# Patient Record
Sex: Female | Born: 1950
Health system: Southern US, Community
[De-identification: ages and names within clinical notes are randomized; demographics above are authoritative.]

## PROBLEM LIST (undated history)

## (undated) DIAGNOSIS — K219 Gastro-esophageal reflux disease without esophagitis: Secondary | ICD-10-CM

## (undated) DIAGNOSIS — I700 Atherosclerosis of aorta: Secondary | ICD-10-CM

## (undated) HISTORY — DX: Gastro-esophageal reflux disease without esophagitis: K21.9

## (undated) HISTORY — DX: Atherosclerosis of aorta: I70.0

---

## 2014-04-10 ENCOUNTER — Encounter: Payer: Self-pay | Admitting: Podiatry

## 2014-04-10 ENCOUNTER — Ambulatory Visit (INDEPENDENT_AMBULATORY_CARE_PROVIDER_SITE_OTHER): Payer: BC Managed Care – PPO | Admitting: Podiatry

## 2014-04-10 VITALS — BP 121/73 | HR 57 | Ht <= 58 in | Wt 125.0 lb

## 2014-04-10 DIAGNOSIS — M79609 Pain in unspecified limb: Secondary | ICD-10-CM

## 2014-04-10 DIAGNOSIS — M722 Plantar fascial fibromatosis: Secondary | ICD-10-CM | POA: Insufficient documentation

## 2014-04-10 DIAGNOSIS — M79606 Pain in leg, unspecified: Secondary | ICD-10-CM | POA: Insufficient documentation

## 2014-04-10 NOTE — Progress Notes (Signed)
Subjective: 63 year old female presents complaining of right heel pain that starts from bottom and radiate to side of leg for duration of 3-4 months. It is getting worse with more severe pain.  She stands all day at work in [employer].  Review of Systems - General ROS: negative for - chills, fatigue, fever, hot flashes, night sweats, sleep disturbance, weight gain or weight loss Ophthalmic ROS: negative ENT ROS: negative Allergy and Immunology ROS: negative Breast ROS: negative for breast lumps Respiratory ROS: no cough, shortness of breath, or wheezing Cardiovascular ROS: no chest pain or dyspnea on exertion Gastrointestinal ROS: no abdominal pain, change in bowel habits, or black or bloody stools Genito-Urinary ROS: no dysuria, trouble voiding, or hematuria Musculoskeletal ROS: negative Neurological ROS: no TIA or stroke symptoms Dermatological ROS: negative.  Objective: Dermatologic: Normotrophic skin without any lesions. Vascular: All pedal pulses are palpable. Orthopedic: Forefoot varus bilateral. Mild bunion bilateral. Neurologic: All epicritic and tactile sensations grossly intact.  Assessment: Plantar fasciitis right.  Plan: Reviewed clinical findings and available treatment options. Right heel injected with 5mg  Triamcinolone and 4mg  Dexamethasone mixed with 1ml of 0.5% Marcaine plain. Patient tolerated well. Return in 2 weeks for follow up.

## 2014-04-10 NOTE — Patient Instructions (Signed)
Seen for pain in right heel. Injection given. Return in 2 weeks.

## 2014-04-24 ENCOUNTER — Encounter: Payer: Self-pay | Admitting: Podiatry

## 2014-04-24 ENCOUNTER — Ambulatory Visit (INDEPENDENT_AMBULATORY_CARE_PROVIDER_SITE_OTHER): Payer: BC Managed Care – PPO | Admitting: Podiatry

## 2014-04-24 VITALS — BP 113/67 | HR 63

## 2014-04-24 DIAGNOSIS — M79606 Pain in leg, unspecified: Secondary | ICD-10-CM

## 2014-04-24 DIAGNOSIS — M722 Plantar fascial fibromatosis: Secondary | ICD-10-CM

## 2014-04-24 DIAGNOSIS — M79609 Pain in unspecified limb: Secondary | ICD-10-CM

## 2014-04-24 DIAGNOSIS — M21969 Unspecified acquired deformity of unspecified lower leg: Secondary | ICD-10-CM | POA: Insufficient documentation

## 2014-04-24 NOTE — Patient Instructions (Signed)
X-rays taken and both feet casted for Orthotics.

## 2014-04-24 NOTE — Progress Notes (Signed)
Subjective: 63 year old female presents for follow up on right heel Injection. Stated that the pain is less and it helped. Still have some pain in right heel.  She stands on her feet all day at work. Wishes to take another injection when the pain gets worse. She wears sandals with lifted heel.  Objective: Excess STJ pronation with hypermobile first ray bilateral. Pain in right heel with prolonged weight bearing.   Radiographic examination was done.  AP view reveal increased lateral deviation angle of the Calcaneocuboid joint with normal forefoot first intermetatarsal space. On Lateral view STJ CYMA line is normal with elevated first ray bilateral. No plantar calcaneal spur noted.   Plan: May benefit from Orthotics with lace up Tennis shoes.   Treatment: Reviewed the findings and both feet casted for Orthotics.

## 2014-06-20 ENCOUNTER — Encounter: Payer: Self-pay | Admitting: Podiatry

## 2014-06-20 ENCOUNTER — Ambulatory Visit (INDEPENDENT_AMBULATORY_CARE_PROVIDER_SITE_OTHER): Payer: BC Managed Care – PPO | Admitting: Podiatry

## 2014-06-20 VITALS — BP 114/70 | HR 53

## 2014-06-20 DIAGNOSIS — M722 Plantar fascial fibromatosis: Secondary | ICD-10-CM

## 2014-06-20 NOTE — Progress Notes (Signed)
Follow up on heel pain. Has not tried Orthotics yet. Will return after trying out orthotics.

## 2014-08-01 ENCOUNTER — Ambulatory Visit (INDEPENDENT_AMBULATORY_CARE_PROVIDER_SITE_OTHER): Payer: BC Managed Care – PPO | Admitting: Podiatry

## 2014-08-01 ENCOUNTER — Encounter: Payer: Self-pay | Admitting: Podiatry

## 2014-08-01 VITALS — BP 101/66 | HR 58 | Ht 59.0 in | Wt 125.0 lb

## 2014-08-01 DIAGNOSIS — M21969 Unspecified acquired deformity of unspecified lower leg: Secondary | ICD-10-CM

## 2014-08-01 DIAGNOSIS — M21961 Unspecified acquired deformity of right lower leg: Secondary | ICD-10-CM

## 2014-08-01 DIAGNOSIS — M79604 Pain in right leg: Secondary | ICD-10-CM

## 2014-08-01 DIAGNOSIS — M722 Plantar fascial fibromatosis: Secondary | ICD-10-CM

## 2014-08-01 DIAGNOSIS — M79609 Pain in unspecified limb: Secondary | ICD-10-CM

## 2014-08-01 NOTE — Progress Notes (Signed)
63 year old female presents complaining of pain in right heel and requests for a cortisone injection. Heel pain is less since last injection but still bothers her enough to requests for a shot.  Objective: No change in findings.   Assessment: Plantar fasciitis right heel. Deformed metatarsal.  Plan: Right heel Injected with mixture of 4 mg Dexamethasone, 4 mg Triamcinolone, and 1 cc of 0.5% Marcaine plain. Patient tolerated well without difficulty.  Return as needed.

## 2014-08-01 NOTE — Patient Instructions (Signed)
Right heel injected with cortisone mixed with Xylocaine. Return as needed.

## 2015-10-08 ENCOUNTER — Ambulatory Visit (INDEPENDENT_AMBULATORY_CARE_PROVIDER_SITE_OTHER): Payer: 59 | Admitting: Podiatry

## 2015-10-08 ENCOUNTER — Encounter: Payer: Self-pay | Admitting: Podiatry

## 2015-10-08 VITALS — BP 116/67 | HR 57

## 2015-10-08 DIAGNOSIS — M722 Plantar fascial fibromatosis: Secondary | ICD-10-CM

## 2015-10-08 NOTE — Patient Instructions (Signed)
Cortisone injection given left heel. Return as needed.

## 2015-10-08 NOTE — Progress Notes (Signed)
Subjective: 64 year old female presents complaining of pain in left heel and requests for a cortisone injection. It has been hurting for the past 2 months.   Objective: No change in findings.  Tenderness over the left heel.   Assessment: Plantar fasciitis left heel. Deformed metatarsal.  Plan: Right heel Injected with mixture of 4 mg Dexamethasone, 4 mg Triamcinolone, and 1 cc of 0.5% Marcaine plain. Patient tolerated well without difficulty.  Return as needed.

## 2016-03-12 ENCOUNTER — Ambulatory Visit (INDEPENDENT_AMBULATORY_CARE_PROVIDER_SITE_OTHER): Payer: BLUE CROSS/BLUE SHIELD | Admitting: Podiatry

## 2016-03-12 ENCOUNTER — Encounter: Payer: Self-pay | Admitting: Podiatry

## 2016-03-12 VITALS — BP 120/67 | HR 62 | Ht 59.0 in | Wt 128.0 lb

## 2016-03-12 DIAGNOSIS — M21961 Unspecified acquired deformity of right lower leg: Secondary | ICD-10-CM | POA: Diagnosis not present

## 2016-03-12 DIAGNOSIS — M216X9 Other acquired deformities of unspecified foot: Secondary | ICD-10-CM | POA: Diagnosis not present

## 2016-03-12 DIAGNOSIS — M722 Plantar fascial fibromatosis: Secondary | ICD-10-CM | POA: Diagnosis not present

## 2016-03-12 DIAGNOSIS — M79605 Pain in left leg: Secondary | ICD-10-CM

## 2016-03-12 DIAGNOSIS — M216X1 Other acquired deformities of right foot: Secondary | ICD-10-CM

## 2016-03-12 DIAGNOSIS — M79673 Pain in unspecified foot: Secondary | ICD-10-CM

## 2016-03-12 DIAGNOSIS — M216X2 Other acquired deformities of left foot: Secondary | ICD-10-CM

## 2016-03-12 NOTE — Patient Instructions (Signed)
Seen for left heel pain. Clinical findings and available treatment options reviewed. Left heel injected. Return as needed.

## 2016-03-12 NOTE — Progress Notes (Signed)
Subjective: 65 year old female presents complaining of pain in left heel and requests for a cortisone injection.  It has been hurting for the past several weeks.  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  She has had previous injection on right heel for heel pain.   Objective: Dermatologic: No abnormal findings. Neurovascular:  Pedal pulses are all palpable. Positive of mild ankle edema on left foot. All epicritic and tactile sensations grossly intact.  Orthopedic:  Positive of forefoot varus, elevated first ray, excess STJ pronation bilateral.  Tenderness to direct and lateral pressure left heel.   Assessment: Plantar fasciitis left heel. STJ hyperpronation bilateral. Elevated first metatarsal bilateral.  Ankle edema left. Pain in left heel.  Plan: Reviewed clinical findings and available treatment options. Left plantar heel Injected with mixture of 4 mg Dexamethasone, 4 mg Triamcinolone, and 1 cc of 0.5% Marcaine plain. Patient tolerated well without difficulty.  Return as needed.

## 2020-09-19 ENCOUNTER — Encounter: Payer: Self-pay | Admitting: *Deleted

## 2021-02-18 ENCOUNTER — Other Ambulatory Visit: Payer: Self-pay

## 2021-02-19 ENCOUNTER — Ambulatory Visit (INDEPENDENT_AMBULATORY_CARE_PROVIDER_SITE_OTHER): Payer: Medicare Other | Admitting: Medical

## 2021-02-19 ENCOUNTER — Encounter: Payer: Self-pay | Admitting: Medical

## 2021-02-19 ENCOUNTER — Ambulatory Visit (HOSPITAL_BASED_OUTPATIENT_CLINIC_OR_DEPARTMENT_OTHER)
Admission: RE | Admit: 2021-02-19 | Discharge: 2021-02-19 | Disposition: A | Discharge status: Home / Self Care | Payer: Medicare Other | Source: Ambulatory Visit | Attending: Medical | Admitting: Medical

## 2021-02-19 VITALS — BP 120/80 | HR 62 | Temp 97.6°F | Ht 59.0 in | Wt 128.6 lb

## 2021-02-19 DIAGNOSIS — E785 Hyperlipidemia, unspecified: Secondary | ICD-10-CM | POA: Diagnosis not present

## 2021-02-19 DIAGNOSIS — M25559 Pain in unspecified hip: Secondary | ICD-10-CM | POA: Diagnosis not present

## 2021-02-19 DIAGNOSIS — K219 Gastro-esophageal reflux disease without esophagitis: Secondary | ICD-10-CM

## 2021-02-19 DIAGNOSIS — R1031 Right lower quadrant pain: Secondary | ICD-10-CM | POA: Diagnosis not present

## 2021-02-19 DIAGNOSIS — M8580 Other specified disorders of bone density and structure, unspecified site: Secondary | ICD-10-CM

## 2021-02-19 LAB — CBC WITH DIFFERENTIAL/PLATELET
Basophils Absolute: 0 10*3/uL (ref 0.0–0.1)
Basophils Relative: 0.4 % (ref 0.0–3.0)
Eosinophils Absolute: 0.1 10*3/uL (ref 0.0–0.7)
Eosinophils Relative: 1.6 % (ref 0.0–5.0)
HCT: 41.6 % (ref 36.0–46.0)
Hemoglobin: 14.2 g/dL (ref 12.0–15.0)
Lymphocytes Relative: 42.2 % (ref 12.0–46.0)
Lymphs Abs: 2 10*3/uL (ref 0.7–4.0)
MCHC: 34.1 g/dL (ref 30.0–36.0)
MCV: 92.5 fl (ref 78.0–100.0)
Monocytes Absolute: 0.3 10*3/uL (ref 0.1–1.0)
Monocytes Relative: 7.2 % (ref 3.0–12.0)
Neutro Abs: 2.3 10*3/uL (ref 1.4–7.7)
Neutrophils Relative %: 48.6 % (ref 43.0–77.0)
Platelets: 286 10*3/uL (ref 150.0–400.0)
RBC: 4.49 Mil/uL (ref 3.87–5.11)
RDW: 12.9 % (ref 11.5–15.5)
WBC: 4.7 10*3/uL (ref 4.0–10.5)

## 2021-02-19 LAB — COMPREHENSIVE METABOLIC PANEL
ALT: 19 U/L (ref 0–35)
AST: 20 U/L (ref 0–37)
Albumin: 4.2 g/dL (ref 3.5–5.2)
Alkaline Phosphatase: 40 U/L (ref 39–117)
BUN: 13 mg/dL (ref 6–23)
CO2: 30 mEq/L (ref 19–32)
Calcium: 9.4 mg/dL (ref 8.4–10.5)
Chloride: 105 mEq/L (ref 96–112)
Creatinine, Ser: 0.67 mg/dL (ref 0.40–1.20)
GFR: 90.45 mL/min (ref >60.00)
Glucose, Bld: 98 mg/dL (ref 70–99)
Potassium: 4 mEq/L (ref 3.5–5.1)
Sodium: 141 mEq/L (ref 135–145)
Total Bilirubin: 0.4 mg/dL (ref 0.2–1.2)
Total Protein: 6.9 g/dL (ref 6.0–8.3)

## 2021-02-19 LAB — LIPID PANEL
Cholesterol: 164 mg/dL (ref 0–200)
HDL: 60.6 mg/dL (ref >39.00)
LDL Cholesterol: 84 mg/dL (ref 0–99)
NonHDL: 102.91
Total CHOL/HDL Ratio: 3
Triglycerides: 96 mg/dL (ref 0.0–149.0)
VLDL: 19.2 mg/dL (ref 0.0–40.0)

## 2021-02-19 NOTE — Patient Instructions (Signed)
Hx of high cholesterol. Will get lipid panel and cmp today. Continue simvastatin.  Hx of gerd. Continue omeprazole 40 mg daily and healthy diet.  For osteopenia use vit d and calcium daily. Consider use of fosamax for bone density.   For rt lower quadrant pain 2 years but recent increasing frequency and more severe ct abd/pelvis to evaluate appendix.  Minimal rt hip area pain on palpation. Get xray of rt hip.  Follow up in one month or as needed.

## 2021-02-19 NOTE — Progress Notes (Signed)
   Subjective:    Patient ID: Haley Walker, female    DOB: 11/09/1951, 70 y.o.   MRN: [id number]  HPI  Pt in for first time.  Pt fomrer pt of [facility].  Pt does not exercise regularly. Pt works at [employer]. Pt states eat healthy. No history of smoking. No alcohol use.  Pt as high cholesterol. She is on simvastatin 40 mg daily.  Pt has gerd history. Pt is on omeprazole 40 mg daily.  EGD comments in epic. IMPRESSIONS and PLAN:  Normal esophagus  Patchy erythema and erosions in the stomach. Biopsies obtained  Normal duodenum  Await pathology results  I will increase omeprazole to 40 mg daily given the peptic changes noted in the stomach   Last mammo 08-08-20. FINDINGS: There are scattered areas of fibroglandular density. There are no suspicious masses or calcifications. There is no significant change from the previous exam.   Pt had colonoscopy. In epic Walker in 2018. Pt told normal and repeat in 10 years.  Pt dexascan Walker in 2020.   Pt has 2 years of rlq pain on and off. But recently pain is becoming more frequent over past month. Pt gradual decrease in appetite. No abrupt change. No fever, no chills or sweats in past 2 weeks.     Review of Systems  Constitutional: Negative for chills, fatigue and fever.  HENT: Negative for congestion, drooling and ear discharge.   Respiratory: Negative for cough, chest tightness, shortness of breath and wheezing.   Cardiovascular: Negative for chest pain and palpitations.  Gastrointestinal: Negative for abdominal pain.  Genitourinary: Negative for dyspareunia, dysuria, flank pain and frequency.  Musculoskeletal: Negative for back pain.  Skin: Negative for rash.  Neurological: Negative for dizziness, speech difficulty, weakness, numbness and headaches.  Hematological: Negative for adenopathy. Does not bruise/bleed easily.  Psychiatric/Behavioral: Negative for behavioral problems and confusion. The patient is not nervous/anxious.         Objective:   Physical Exam  General Mental Status- Alert. General Appearance- Not in acute distress.   Skin General: Color- Normal Color. Moisture- Normal Moisture.  Neck Carotid Arteries- Normal color. Moisture- Normal Moisture. No carotid bruits. No JVD.  Chest and Lung Exam Auscultation: Breath Sounds:-Normal.  Cardiovascular Auscultation:Rythm- Regular. Murmurs & Other Heart Sounds:Auscultation of the heart reveals- No Murmurs.  Abdomen Inspection:-Inspeection Normal. Palpation/Percussion:Note:No mass. Palpation and Percussion of the abdomen reveal- rt lower quadrant Tenderness on exam., Non Distended + BS, no rebound or guarding.    Neurologic Cranial Nerve exam:- CN III-XII intact(No nystagmus), symmetric smile. Strength:- 5/5 equal and symmetric strength both upper and lower extremities.  Rt hip- mild pain to palpation and rom.      Assessment & Plan:  Hx of high cholesterol. Will get lipid panel and cmp today. Continue simvastatin.  Hx of gerd. Continue omeprazole 40 mg daily and healthy diet.  For osteopenia use vit d and calcium daily. Consider use of fosamax for bone density.   For rt lower quadrant pain 2 years but recent increasing frequency and more severe ct abd/pelvis to evaluate appendix.  Minimal rt hip area pain on palpation. Get xray of rt hip.  Follow up in one month or as needed.   Esperanza Richters, PA-C

## 2021-02-20 ENCOUNTER — Other Ambulatory Visit: Payer: Self-pay

## 2021-02-20 ENCOUNTER — Ambulatory Visit (HOSPITAL_BASED_OUTPATIENT_CLINIC_OR_DEPARTMENT_OTHER)
Admission: RE | Admit: 2021-02-20 | Discharge: 2021-02-20 | Disposition: A | Discharge status: Home / Self Care | Payer: Medicare Other | Source: Ambulatory Visit | Attending: Medical | Admitting: Medical

## 2021-02-20 ENCOUNTER — Encounter (HOSPITAL_BASED_OUTPATIENT_CLINIC_OR_DEPARTMENT_OTHER): Payer: Self-pay

## 2021-02-20 DIAGNOSIS — R1031 Right lower quadrant pain: Secondary | ICD-10-CM | POA: Diagnosis present

## 2021-02-20 MED ORDER — IOHEXOL 300 MG/ML  SOLN
100.0000 mL | Freq: Once | INTRAMUSCULAR | Status: AC | PRN
  Administered 2021-02-20: 100 mL via INTRAVENOUS

## 2021-02-25 ENCOUNTER — Other Ambulatory Visit (HOSPITAL_COMMUNITY): Payer: Self-pay | Admitting: Medical

## 2021-02-25 ENCOUNTER — Other Ambulatory Visit: Payer: Self-pay

## 2021-02-25 LAB — VITAMIN D 1,25 DIHYDROXY
Vitamin D 1, 25 (OH)2 Total: 60 pg/mL (ref 18–72)
Vitamin D2 1, 25 (OH)2: <8 pg/mL
Vitamin D3 1, 25 (OH)2: 60 pg/mL

## 2021-02-25 MED ORDER — SIMVASTATIN 40 MG PO TABS
40.0000 mg | ORAL_TABLET | Freq: Every day | ORAL | 2 refills | Status: DC
Start: 2021-02-25 — End: 2021-04-22

## 2021-02-25 MED ORDER — OMEPRAZOLE 40 MG PO CPDR: 40.0000 mg | DELAYED_RELEASE_CAPSULE | Freq: Every day | ORAL | 2 refills | Status: DC

## 2021-02-25 MED FILL — OMEPRAZOLE 40 MG CPDR: 40 | 30 days supply | Qty: 30 | Fill #0

## 2021-02-28 ENCOUNTER — Ambulatory Visit (INDEPENDENT_AMBULATORY_CARE_PROVIDER_SITE_OTHER): Payer: Medicare Other | Admitting: Medical

## 2021-02-28 ENCOUNTER — Other Ambulatory Visit: Payer: Self-pay

## 2021-02-28 VITALS — BP 112/50 | HR 65 | Temp 98.2°F | Resp 18 | Ht 59.0 in | Wt 130.2 lb

## 2021-02-28 DIAGNOSIS — G8929 Other chronic pain: Secondary | ICD-10-CM

## 2021-02-28 DIAGNOSIS — M25561 Pain in right knee: Secondary | ICD-10-CM | POA: Diagnosis not present

## 2021-02-28 DIAGNOSIS — M25559 Pain in unspecified hip: Secondary | ICD-10-CM

## 2021-02-28 DIAGNOSIS — I700 Atherosclerosis of aorta: Secondary | ICD-10-CM | POA: Diagnosis not present

## 2021-02-28 MED ORDER — MELOXICAM 7.5 MG PO TABS: 7.5000 mg | ORAL_TABLET | Freq: Every day | ORAL | 0 refills | Status: DC

## 2021-02-28 NOTE — Patient Instructions (Addendum)
For rt hip pain and rt knee pain will rx low dose meloxicam to use daily. DC voltaren gel.  Refer to sports med MD.   Reviewed CT scan report today. As we discussed normal ovaries/no masses but starting next year plan to refer you to gyn to do periodic exam. Maybe [place] if they determine needed on intemittent regular basis.  Discussed ct findings of aortic atherosclerosis. Continue statin and low cholesterol diet.  Follow up September or sooner if needed

## 2021-02-28 NOTE — Progress Notes (Signed)
Subjective:    Patient ID: Haley Walker, female    DOB: 1951/01/26, 70 y.o.   MRN: [id number]  HPI  Pt in for follow up for mild degenerative changes to rt hip. Also has some rt knee pain as well.   Pt mentioned pain on her last visit. She had some rlq area pain and hip pain. CT of abd/pelvis was negative.  Appendix was normal.   Remote injury 17 years ago fell down steps. No fractures. Per pt on and off pain since then    On review pt has not seen gyn. Hx of hysterectomy. Ct showed no masses ovaries.    Review of Systems  Constitutional: Negative for chills, fatigue and fever.  Respiratory: Negative for cough, chest tightness, shortness of breath and wheezing.   Cardiovascular: Negative for chest pain and palpitations.  Gastrointestinal: Negative for abdominal pain.  Genitourinary: Negative for decreased urine volume, difficulty urinating, dysuria, flank pain, frequency, hematuria, urgency and vaginal pain.  Musculoskeletal: Negative for back pain.  Skin: Negative for rash.  Hematological: Negative for adenopathy.  Psychiatric/Behavioral: Negative for behavioral problems and confusion.    Past Medical History:  Diagnosis Date  . Acid reflux      Social History   Socioeconomic History  . Marital status: Married    Spouse name: Not on file  . Number of children: Not on file  . Years of education: Not on file  . Highest education level: Not on file  Occupational History  . Not on file  Tobacco Use  . Smoking status: Never Smoker  . Smokeless tobacco: Never Used  Vaping Use  . Vaping Use: Never used  Substance and Sexual Activity  . Alcohol use: Never    Alcohol/week: 0.0 standard drinks  . Drug use: Never  . Sexual activity: Not on file  Other Topics Concern  . Not on file  Social History Narrative  . Not on file   Social Determinants of Health   Financial Resource Strain: Not on file  Food Insecurity: Not on file  Transportation Needs: Not on file   Physical Activity: Not on file  Stress: Not on file  Social Connections: Not on file  Intimate Partner Violence: Not on file    Past Surgical History:  Procedure Laterality Date  . CESAREAN SECTION     3 times    Family History  Problem Relation Age of Onset  . Alzheimer's disease Mother   . Alzheimer's disease Father     No Known Allergies  Current Outpatient Medications on File Prior to Visit  Medication Sig Dispense Refill  . Glucosamine-Chondroitin (GLUCOSAMINE CHONDR COMPLEX PO) Take by mouth.    . Multiple Vitamin (MULTIVITAMIN ADULT PO) Take by mouth.    Marland Kitchen omeprazole (PRILOSEC) 40 MG capsule Take 1 capsule (40 mg total) by mouth daily. 30 capsule 2  . simvastatin (ZOCOR) 40 MG tablet Take 1 tablet (40 mg total) by mouth daily at 6 PM. 30 tablet 2   No current facility-administered medications on file prior to visit.    BP (!) 112/50   Pulse 65   Temp 98.2 F (36.8 C)   Resp 18   Ht 4\' 11"  (1.499 m)   Wt 130 lb 3.2 oz (59.1 kg)   SpO2 100%   BMI 26.30 kg/m       Objective:   Physical Exam   General- No acute distress. Pleasant patient. Neck- Full range of motion, no jvd Lungs- Clear, even and unlabored. Heart-  regular rate and rhythm. Neurologic- CNII- XII grossly intact. Back- no mid lumbar pain. Rt hip- mild pain on rom. Posterior region of hip.  Rt knee- mild crepitus on flexion and extension. Mild tender to palpation tibial plateau.     Assessment & Plan:  For rt hip pain and rt knee pain will rx low dose meloxicam to use daily. DC voltaren gel.  Refer to sports med MD.   Reviewed CT scan report today. As we discussed normal ovaries/no masses but starting next year plan to refer you to gyn to do periodic exam. Maybe [place] if they determine needed on intemittent regular basis.  Discussed ct findings of aortic atherosclerosis. Continue statin and low cholesterol diet.  Follow up September or sooner if needed   [employer], [employer]

## 2021-02-28 NOTE — Addendum Note (Signed)
Addended by: Maximino Sarin on: 02/28/2021 10:29 AM   Modules accepted: Orders

## 2021-03-12 ENCOUNTER — Encounter: Payer: Self-pay | Admitting: Family Medicine

## 2021-03-12 ENCOUNTER — Ambulatory Visit: Payer: Self-pay

## 2021-03-12 ENCOUNTER — Ambulatory Visit: Payer: Medicare Other | Admitting: Family Medicine

## 2021-03-12 ENCOUNTER — Other Ambulatory Visit (HOSPITAL_BASED_OUTPATIENT_CLINIC_OR_DEPARTMENT_OTHER): Payer: Self-pay

## 2021-03-12 ENCOUNTER — Other Ambulatory Visit: Payer: Self-pay

## 2021-03-12 VITALS — BP 118/64 | Ht 59.0 in | Wt 129.0 lb

## 2021-03-12 DIAGNOSIS — M222X1 Patellofemoral disorders, right knee: Secondary | ICD-10-CM

## 2021-03-12 DIAGNOSIS — M5416 Radiculopathy, lumbar region: Secondary | ICD-10-CM | POA: Diagnosis not present

## 2021-03-12 DIAGNOSIS — M25561 Pain in right knee: Secondary | ICD-10-CM

## 2021-03-12 MED ORDER — PREDNISONE 5 MG PO TABS
ORAL_TABLET | ORAL | 0 refills | Status: DC
  Filled 2021-03-12: qty 21, 6d supply, fill #0

## 2021-03-12 NOTE — Progress Notes (Signed)
  Brittni Denson - 70 y.o. female MRN [id number]  Date of birth: 01/25/1951  SUBJECTIVE:  Including CC & ROS.  No chief complaint on file.   Embree Truby is a 70 y.o. female that is presenting with pain of the right leg laterally in the right knee.  Pain has been ongoing for a few weeks.  Seems to be intermittent in nature.  Worse with standing and activity..  Independent review of the right hip x-ray from 3/16 shows no acute changes.  Independent review of the CT abdomen pelvis from 3/17 shows no significant degenerative change of the hip joints.   Review of Systems See HPI   HISTORY: Past Medical, Surgical, Social, and Family History Reviewed & Updated per EMR.   Pertinent Historical Findings include:  Past Medical History:  Diagnosis Date  . Acid reflux     Past Surgical History:  Procedure Laterality Date  . CESAREAN SECTION     3 times    Family History  Problem Relation Age of Onset  . Alzheimer's disease Mother   . Alzheimer's disease Father     Social History   Socioeconomic History  . Marital status: Married    Spouse name: Not on file  . Number of children: Not on file  . Years of education: Not on file  . Highest education level: Not on file  Occupational History  . Not on file  Tobacco Use  . Smoking status: Never Smoker  . Smokeless tobacco: Never Used  Vaping Use  . Vaping Use: Never used  Substance and Sexual Activity  . Alcohol use: Never    Alcohol/week: 0.0 standard drinks  . Drug use: Never  . Sexual activity: Not on file  Other Topics Concern  . Not on file  Social History Narrative  . Not on file   Social Determinants of Health   Financial Resource Strain: Not on file  Food Insecurity: Not on file  Transportation Needs: Not on file  Physical Activity: Not on file  Stress: Not on file  Social Connections: Not on file  Intimate Partner Violence: Not on file     PHYSICAL EXAM:  VS: BP 118/64 (BP Location: Right Arm, Patient Position:  Sitting, Cuff Size: Normal)   Ht 4\' 11"  (1.499 m)   Wt 129 lb (58.5 kg)   BMI 26.05 kg/m  Physical Exam Gen: NAD, alert, cooperative with exam, well-appearing MSK:  Right leg and knee: No effusion at the knee: Normal range of motion. Normal strength resistance. Some instability with 1 leg standing on the right. Negative straight leg raise. Neurovascularly intact  Limited ultrasound: Right knee:  No effusion within the suprapatellar pouch. Normal-appearing quadricep patellar tendon. Normal-appearing medial joint space. Normal-appearing lateral joint space.   Summary: No structural changes appreciated.  Ultrasound and interpretation by , MD    ASSESSMENT & PLAN:   Patellofemoral pain syndrome of right knee Symptoms seem most consistent with patellofemoral at the knee.  No structural changes on ultrasound. -Counseled on home exercise therapy and supportive care. -Prednisone. -Could consider physical therapy or injection.  Lumbar radiculopathy Has pain down the lateral aspect of the leg which seems more consistent with a radicular type pain as opposed to hip or knee being the origin. -Counseled on home exercise therapy and supportive care. -Prednisone. -Could consider gabapentin or physical therapy.

## 2021-03-12 NOTE — Patient Instructions (Signed)
Nice to meet you Please try ice as needed for the knee  Please try the exercises   Please send me a message in MyChart with any questions or updates.  Please see me back in 4 weeks.   --Dr. Jordan Likes

## 2021-03-12 NOTE — Assessment & Plan Note (Signed)
Symptoms seem most consistent with patellofemoral at the knee.  No structural changes on ultrasound. -Counseled on home exercise therapy and supportive care. -Prednisone. -Could consider physical therapy or injection.

## 2021-03-12 NOTE — Assessment & Plan Note (Signed)
Has pain down the lateral aspect of the leg which seems more consistent with a radicular type pain as opposed to hip or knee being the origin. -Counseled on home exercise therapy and supportive care. -Prednisone. -Could consider gabapentin or physical therapy.

## 2021-04-08 ENCOUNTER — Ambulatory Visit: Payer: Medicare Other | Attending: Internal Medicine

## 2021-04-08 ENCOUNTER — Other Ambulatory Visit (HOSPITAL_BASED_OUTPATIENT_CLINIC_OR_DEPARTMENT_OTHER): Payer: Self-pay

## 2021-04-08 DIAGNOSIS — Z23 Encounter for immunization: Secondary | ICD-10-CM

## 2021-04-08 MED FILL — Omeprazole Cap Delayed Release 40 MG: ORAL | 30 days supply | Qty: 30 | Fill #0 | Status: AC

## 2021-04-08 NOTE — Progress Notes (Signed)
   Covid-19 Vaccination Clinic  Name:  Haley Walker    MRN: [id number] DOB: 1951-02-25  04/08/2021  [name] was observed post Covid-19 immunization for 15 minutes without incident. She was provided with Vaccine Information Sheet and instruction to access the V-Safe system.   [name] was instructed to call 911 with any severe reactions post vaccine: Marland Kitchen Difficulty breathing  . Swelling of face and throat  . A fast heartbeat  . A bad rash all over body  . Dizziness and weakness   Immunizations Administered    Name Date Dose VIS Date Route   PFIZER Comrnaty(Gray TOP) Covid-19 Vaccine 04/08/2021  9:02 AM 0.3 mL 11/14/2020 Intramuscular   Manufacturer: [employer], [employer]   Lot: [id number]   NDC: [phone]

## 2021-04-15 ENCOUNTER — Other Ambulatory Visit (HOSPITAL_BASED_OUTPATIENT_CLINIC_OR_DEPARTMENT_OTHER): Payer: Self-pay

## 2021-04-15 MED ORDER — PFIZER-BIONT COVID-19 VAC-TRIS 30 MCG/0.3ML IM SUSP
INTRAMUSCULAR | 0 refills | Status: DC
  Filled 2021-04-15: qty 0.3, 1d supply, fill #0

## 2021-04-22 ENCOUNTER — Other Ambulatory Visit: Payer: Self-pay | Admitting: Medical

## 2021-04-22 ENCOUNTER — Ambulatory Visit: Payer: Medicare Other | Admitting: Family Medicine

## 2021-04-22 ENCOUNTER — Other Ambulatory Visit: Payer: Self-pay

## 2021-04-22 ENCOUNTER — Other Ambulatory Visit (HOSPITAL_BASED_OUTPATIENT_CLINIC_OR_DEPARTMENT_OTHER): Payer: Self-pay

## 2021-04-22 ENCOUNTER — Ambulatory Visit (HOSPITAL_BASED_OUTPATIENT_CLINIC_OR_DEPARTMENT_OTHER)
Admission: RE | Admit: 2021-04-22 | Discharge: 2021-04-22 | Disposition: A | Discharge status: Home / Self Care | Payer: Medicare Other | Source: Ambulatory Visit | Attending: Family Medicine | Admitting: Family Medicine

## 2021-04-22 ENCOUNTER — Encounter: Payer: Self-pay | Admitting: Family Medicine

## 2021-04-22 VITALS — BP 120/78 | Ht 59.0 in | Wt 129.0 lb

## 2021-04-22 DIAGNOSIS — Z780 Asymptomatic menopausal state: Secondary | ICD-10-CM | POA: Insufficient documentation

## 2021-04-22 DIAGNOSIS — M8589 Other specified disorders of bone density and structure, multiple sites: Secondary | ICD-10-CM | POA: Diagnosis not present

## 2021-04-22 DIAGNOSIS — M5416 Radiculopathy, lumbar region: Secondary | ICD-10-CM | POA: Insufficient documentation

## 2021-04-22 DIAGNOSIS — M222X1 Patellofemoral disorders, right knee: Secondary | ICD-10-CM

## 2021-04-22 DIAGNOSIS — Z13820 Encounter for screening for osteoporosis: Secondary | ICD-10-CM | POA: Diagnosis present

## 2021-04-22 MED ORDER — OMEPRAZOLE 40 MG PO CPDR
1.0000 | DELAYED_RELEASE_CAPSULE | Freq: Every day | ORAL | 2 refills | Status: DC
  Filled 2021-04-22 – 2021-08-04 (×3): qty 90, 90d supply, fill #0

## 2021-04-22 MED ORDER — SIMVASTATIN 40 MG PO TABS
ORAL_TABLET | Freq: Every day | ORAL | 2 refills | Status: DC
  Filled 2021-04-22: qty 30, fill #0

## 2021-04-22 MED ORDER — OMEPRAZOLE 40 MG PO CPDR
1.0000 | DELAYED_RELEASE_CAPSULE | Freq: Every day | ORAL | 2 refills | Status: DC
  Filled 2021-04-22: qty 30, 30d supply, fill #0

## 2021-04-22 MED ORDER — SIMVASTATIN 40 MG PO TABS
40.0000 mg | ORAL_TABLET | Freq: Every day | ORAL | 2 refills | Status: DC
  Filled 2021-04-22: qty 90, 90d supply, fill #0
  Filled 2021-08-21: qty 90, 90d supply, fill #1
  Filled 2022-01-07: qty 90, 90d supply, fill #2

## 2021-04-22 NOTE — Progress Notes (Signed)
  Haley Walker - 70 y.o. female MRN [id number]  Date of birth: 11-Jul-1951  SUBJECTIVE:  Including CC & ROS.  No chief complaint on file.   Haley Walker is a 70 y.o. female that is following up for her right leg pain.  She reports about 50% improvement of her right knee.  She still encounters weakness in the lower extremities.  She feels this more on her thighs bilaterally.  Feels it more at night and in the right side.   Review of Systems See HPI   HISTORY: Past Medical, Surgical, Social, and Family History Reviewed & Updated per EMR.   Pertinent Historical Findings include:  Past Medical History:  Diagnosis Date  . Acid reflux     Past Surgical History:  Procedure Laterality Date  . CESAREAN SECTION     3 times    Family History  Problem Relation Age of Onset  . Alzheimer's disease Mother   . Alzheimer's disease Father     Social History   Socioeconomic History  . Marital status: Married    Spouse name: Not on file  . Number of children: Not on file  . Years of education: Not on file  . Highest education level: Not on file  Occupational History  . Not on file  Tobacco Use  . Smoking status: Never Smoker  . Smokeless tobacco: Never Used  Vaping Use  . Vaping Use: Never used  Substance and Sexual Activity  . Alcohol use: Never    Alcohol/week: 0.0 standard drinks  . Drug use: Never  . Sexual activity: Not on file  Other Topics Concern  . Not on file  Social History Narrative  . Not on file   Social Determinants of Health   Financial Resource Strain: Not on file  Food Insecurity: Not on file  Transportation Needs: Not on file  Physical Activity: Not on file  Stress: Not on file  Social Connections: Not on file  Intimate Partner Violence: Not on file     PHYSICAL EXAM:  VS: BP 120/78 (BP Location: Left Arm, Patient Position: Sitting, Cuff Size: Normal)   Ht 4\' 11"  (1.499 m)   Wt 129 lb (58.5 kg)   BMI 26.05 kg/m  Physical Exam Gen: NAD, alert,  cooperative with exam, well-appearing MSK:  Right and left leg: No signs of atrophy. Normal patellar reflexes. Normal Achilles reflexes. Normal strength resistance. Neurovascularly intact     ASSESSMENT & PLAN:   Patellofemoral pain syndrome of right knee About 50% improvement with the prednisone.  Still has pain from time to time. -Counseled on home exercise therapy and supportive care. -Referral to physical therapy. -Could consider Green sport insoles.  Lumbar radiculopathy The discomfort seems to be more apparent at night.  Also feels weakness.  CT of her abdomen pelvis was demonstrating degenerative change at the disc of L5-S1 and at the facet joints of the lower lumbar spine.  Symptoms could be associated with radiculopathy versus spinal stenosis. -Counseled on home exercise therapy and supportive care. -Bone density. -Referral to physical therapy. -Could consider gabapentin or MRI to evaluate for stenosis.

## 2021-04-22 NOTE — Assessment & Plan Note (Signed)
About 50% improvement with the prednisone.  Still has pain from time to time. -Counseled on home exercise therapy and supportive care. -Referral to physical therapy. -Could consider Green sport insoles.

## 2021-04-22 NOTE — Assessment & Plan Note (Signed)
The discomfort seems to be more apparent at night.  Also feels weakness.  CT of her abdomen pelvis was demonstrating degenerative change at the disc of L5-S1 and at the facet joints of the lower lumbar spine.  Symptoms could be associated with radiculopathy versus spinal stenosis. -Counseled on home exercise therapy and supportive care. -Bone density. -Referral to physical therapy. -Could consider gabapentin or MRI to evaluate for stenosis.

## 2021-04-22 NOTE — Patient Instructions (Signed)
Good to see you Please try physical therapy  Please stop by if you get new shoes so I can put the insoles in them.  I will call with the bone density results.   Please send me a message in MyChart with any questions or updates.  Please see me back in 4 weeks.   --Dr. Jordan Likes

## 2021-04-23 ENCOUNTER — Telehealth: Payer: Self-pay | Admitting: Family Medicine

## 2021-04-23 NOTE — Telephone Encounter (Signed)
Informed of results.   Myra Rude, MD [facility] 04/23/2021, 8:22 AM

## 2021-05-07 ENCOUNTER — Ambulatory Visit: Payer: Medicare Other | Admitting: Physical Therapy

## 2021-07-03 ENCOUNTER — Other Ambulatory Visit (HOSPITAL_BASED_OUTPATIENT_CLINIC_OR_DEPARTMENT_OTHER): Payer: Self-pay

## 2021-07-10 ENCOUNTER — Other Ambulatory Visit (HOSPITAL_BASED_OUTPATIENT_CLINIC_OR_DEPARTMENT_OTHER): Payer: Self-pay

## 2021-07-28 ENCOUNTER — Telehealth: Payer: Self-pay | Admitting: Medical

## 2021-07-28 NOTE — Telephone Encounter (Signed)
OK 

## 2021-07-28 NOTE — Telephone Encounter (Signed)
Patien's sister is calling to request a toc from [place] to [place]. Patient sister is Molli Hazard "Myriam Jacobson" and she currently sees [facility]. She states that last time she saw Dr. Carmelia Roller he agreed to take her sister(this patient) and her sister's husband as new patients. Patient's sister was made aware that prior authorization and approval needed to be made before making the toc appointments.  Please Advice.

## 2021-07-29 NOTE — Telephone Encounter (Signed)
Called pt's sister (who helps her translate) to set up new patient appointment but she stated she would call back to schedule.

## 2021-08-04 ENCOUNTER — Other Ambulatory Visit (HOSPITAL_BASED_OUTPATIENT_CLINIC_OR_DEPARTMENT_OTHER): Payer: Self-pay

## 2021-08-13 ENCOUNTER — Other Ambulatory Visit (HOSPITAL_COMMUNITY): Payer: Self-pay

## 2021-08-21 ENCOUNTER — Other Ambulatory Visit (HOSPITAL_BASED_OUTPATIENT_CLINIC_OR_DEPARTMENT_OTHER): Payer: Self-pay

## 2021-09-02 ENCOUNTER — Ambulatory Visit: Payer: Medicare Other | Admitting: Medical

## 2021-09-02 ENCOUNTER — Ambulatory Visit: Payer: Medicare Other

## 2021-09-24 ENCOUNTER — Encounter: Payer: Self-pay | Admitting: Family Medicine

## 2021-09-24 ENCOUNTER — Other Ambulatory Visit: Payer: Self-pay

## 2021-09-24 ENCOUNTER — Ambulatory Visit (INDEPENDENT_AMBULATORY_CARE_PROVIDER_SITE_OTHER): Payer: Medicare Other | Admitting: Family Medicine

## 2021-09-24 VITALS — BP 108/76 | HR 59 | Temp 98.4°F | Ht 59.0 in | Wt 130.0 lb

## 2021-09-24 DIAGNOSIS — Z1159 Encounter for screening for other viral diseases: Secondary | ICD-10-CM | POA: Diagnosis not present

## 2021-09-24 DIAGNOSIS — I700 Atherosclerosis of aorta: Secondary | ICD-10-CM

## 2021-09-24 DIAGNOSIS — K219 Gastro-esophageal reflux disease without esophagitis: Secondary | ICD-10-CM

## 2021-09-24 DIAGNOSIS — Z23 Encounter for immunization: Secondary | ICD-10-CM | POA: Diagnosis not present

## 2021-09-24 DIAGNOSIS — Z1231 Encounter for screening mammogram for malignant neoplasm of breast: Secondary | ICD-10-CM

## 2021-09-24 DIAGNOSIS — H269 Unspecified cataract: Secondary | ICD-10-CM

## 2021-09-24 NOTE — Progress Notes (Addendum)
Chief Complaint  Patient presents with   transfer of care    Subjective: Hyperlipidemia Patient presents for Hyperlipidemia follow up. Here w friend who helps interpret and husband.  Currently taking Zocor 40 mg/d and compliance with treatment thus far has been good. She denies myalgias. She is adhering to a healthy diet. Exercise: none The patient is not known to have coexisting coronary artery disease.  GERD- hx of GERD, taking omeprazole 40 mg/d. Compliant, no AE's. S/s's are controlled.   Past Medical History:  Diagnosis Date   Acid reflux     Objective: BP 108/76   Pulse (!) 59   Temp 98.4 F (36.9 C) (Oral)   Ht 4\' 11"  (1.499 m)   Wt 130 lb (59 kg)   SpO2 97%   BMI 26.26 kg/m  General: Awake, appears stated age HEENT: MMM Heart: Reg rhythm, bradycardic, no LE edema, no bruits Lungs: CTAB, no rales, wheezes or rhonchi. No accessory muscle use Abd: BS+, NT, ND Psych: Age appropriate judgment and insight, normal affect and mood  Assessment and Plan: Atherosclerosis of aorta (HCC) - Plan: Lipid panel, Comprehensive metabolic panel  Gastroesophageal reflux disease, unspecified whether esophagitis present  Encounter for screening mammogram for malignant neoplasm of breast - Plan: MM DIGITAL SCREENING BILATERAL  Encounter for hepatitis C screening test for low risk patient - Plan: Hepatitis C antibody  Cataract of both eyes, unspecified cataract type - Plan: Ambulatory referral to Ophthalmology  Need for influenza vaccination - Plan: Flu Vaccine QUAD High Dose(Fluad)  Chronic, stable. Cont Zocor 40 mg/d.  Chronic, stable. Cont Prilosec 40 mg/d.  F/u in 6 mo for CPE or prn. . The patient and her friend voiced understanding and agreement to the plan.  [place], DO 09/24/21  2:31 PM

## 2021-09-24 NOTE — Patient Instructions (Signed)
Give us 2-3 business days to get the results of your labs back.   Keep the diet clean and stay active.  If you do not hear anything about your referral in the next 1-2 weeks, call our office and ask for an update.  Let us know if you need anything. 

## 2021-09-25 ENCOUNTER — Other Ambulatory Visit (INDEPENDENT_AMBULATORY_CARE_PROVIDER_SITE_OTHER): Payer: Medicare Other

## 2021-09-25 DIAGNOSIS — I700 Atherosclerosis of aorta: Secondary | ICD-10-CM

## 2021-09-25 DIAGNOSIS — Z1159 Encounter for screening for other viral diseases: Secondary | ICD-10-CM

## 2021-09-25 LAB — COMPREHENSIVE METABOLIC PANEL
ALT: 31 U/L (ref 0–35)
AST: 29 U/L (ref 0–37)
Albumin: 4.4 g/dL (ref 3.5–5.2)
Alkaline Phosphatase: 40 U/L (ref 39–117)
BUN: 16 mg/dL (ref 6–23)
CO2: 28 mEq/L (ref 19–32)
Calcium: 9.4 mg/dL (ref 8.4–10.5)
Chloride: 106 mEq/L (ref 96–112)
Creatinine, Ser: 0.72 mg/dL (ref 0.40–1.20)
GFR: 86.16 mL/min (ref >60.00)
Glucose, Bld: 91 mg/dL (ref 70–99)
Potassium: 4.1 mEq/L (ref 3.5–5.1)
Sodium: 142 mEq/L (ref 135–145)
Total Bilirubin: 0.5 mg/dL (ref 0.2–1.2)
Total Protein: 6.7 g/dL (ref 6.0–8.3)

## 2021-09-25 LAB — LIPID PANEL
Cholesterol: 205 mg/dL — ABNORMAL HIGH (ref 0–200)
HDL: 66.1 mg/dL
LDL Cholesterol: 115 mg/dL — ABNORMAL HIGH (ref 0–99)
NonHDL: 139.06
Total CHOL/HDL Ratio: 3
Triglycerides: 120 mg/dL (ref 0.0–149.0)
VLDL: 24 mg/dL (ref 0.0–40.0)

## 2021-09-26 LAB — HEPATITIS C ANTIBODY
Hepatitis C Ab: NONREACTIVE
SIGNAL TO CUT-OFF: 0.04 (ref <1.00)

## 2021-11-04 ENCOUNTER — Other Ambulatory Visit: Payer: Self-pay

## 2021-11-04 ENCOUNTER — Ambulatory Visit (HOSPITAL_BASED_OUTPATIENT_CLINIC_OR_DEPARTMENT_OTHER)
Admission: RE | Admit: 2021-11-04 | Discharge: 2021-11-04 | Disposition: A | Discharge status: Home / Self Care | Payer: Medicare Other | Source: Ambulatory Visit | Attending: Family Medicine | Admitting: Family Medicine

## 2021-11-04 DIAGNOSIS — Z1231 Encounter for screening mammogram for malignant neoplasm of breast: Secondary | ICD-10-CM | POA: Insufficient documentation

## 2021-12-15 ENCOUNTER — Ambulatory Visit (INDEPENDENT_AMBULATORY_CARE_PROVIDER_SITE_OTHER): Payer: Medicare Other | Admitting: Family Medicine

## 2021-12-15 ENCOUNTER — Other Ambulatory Visit (HOSPITAL_BASED_OUTPATIENT_CLINIC_OR_DEPARTMENT_OTHER): Payer: Self-pay

## 2021-12-15 ENCOUNTER — Encounter: Payer: Self-pay | Admitting: Family Medicine

## 2021-12-15 VITALS — BP 108/60 | HR 60 | Temp 97.9°F | Ht 59.0 in | Wt 130.5 lb

## 2021-12-15 DIAGNOSIS — I700 Atherosclerosis of aorta: Secondary | ICD-10-CM | POA: Diagnosis not present

## 2021-12-15 DIAGNOSIS — K219 Gastro-esophageal reflux disease without esophagitis: Secondary | ICD-10-CM

## 2021-12-15 DIAGNOSIS — M545 Low back pain, unspecified: Secondary | ICD-10-CM | POA: Diagnosis not present

## 2021-12-15 DIAGNOSIS — G8929 Other chronic pain: Secondary | ICD-10-CM | POA: Insufficient documentation

## 2021-12-15 MED ORDER — PANTOPRAZOLE SODIUM 40 MG PO TBEC
40.0000 mg | DELAYED_RELEASE_TABLET | Freq: Every day | ORAL | 1 refills | Status: DC
  Filled 2021-12-15: qty 30, 30d supply, fill #0
  Filled 2022-03-03: qty 30, 30d supply, fill #1

## 2021-12-15 NOTE — Patient Instructions (Signed)

## 2021-12-15 NOTE — Progress Notes (Addendum)
Musculoskeletal Exam  Patient: Haley Walker DOB: [birth year]  DOS: 12/15/2021  SUBJECTIVE:  Chief Complaint:   Chief Complaint  Patient presents with   Back Pain   Gastroesophageal Reflux    Haley Walker is a 71 y.o.  female for evaluation and treatment of back pain.   Onset:  a couple years ago. No inj or change in activity.  Location: lower, b/l Character:  aching  Progression of issue:  is unchanged Associated symptoms: GI s/s's; concerned about pancreatic cancer Denies bowel/bladder incontinence or weakness, bruising, swelling, redness She does not stretch.  Treatment: to date has been none.   Neurovascular symptoms: no  GERD Has had for a couple years. S/s's worse in AM. Omeprazole 40 mg qd would help, but she only takes it in the morning. Diet is fair, nothing really flares it.  No nausea, vomiting, diarrhea, bleeding, unintentional weight loss.  She does have slight epigastric tenderness.  No specific foods seem to trigger it  Past Medical History:  Diagnosis Date   Acid reflux     Objective:  VITAL SIGNS: BP 108/60    Pulse 60    Temp 97.9 F (36.6 C) (Oral)    Ht 4\' 11"  (1.499 m)    Wt 130 lb 8 oz (59.2 kg)    SpO2 98%    BMI 26.36 kg/m  Constitutional: Well formed, well developed. No acute distress. HENT: Normocephalic, atraumatic.  Heart: RRR Thorax & Lungs: Clear to auscultation bilaterally.  No accessory muscle use Abd: BS+, S, ND, TTP in epigastric region to deep palpation Musculoskeletal: low back.   Tenderness to palpation: yes, over the erector spinae muscle group in the lower thoracic and upper lumbar region bilaterally Deformity: no Ecchymosis: no Straight leg test: negative for Poor hamstring flexibility b/l. Neurologic: Normal sensory function. No focal deficits noted. DTR's equal and symmetric in LE's. No clonus. Psychiatric: Normal mood. Age appropriate judgment and insight. Alert & oriented x 3.    Assessment:  Gastroesophageal reflux disease,  unspecified whether esophagitis present - Plan: pantoprazole (PROTONIX) 40 MG tablet  Chronic bilateral low back pain without sciatica  Atherosclerosis of aorta (HCC), Chronic  Plan: 1.  Chronic, unstable.  Change omeprazole to pantoprazole 40 mg daily.  Counseled on reflux precautions.  Follow-up in 2 months. 2.  Chronic, not controlled.  Stretches/exercises, heat, ice, Tylenol, physical therapy if no better. The patient voiced understanding and agreement to the plan.   [place], DO 12/15/21  3:49 PM

## 2022-01-07 ENCOUNTER — Other Ambulatory Visit (HOSPITAL_BASED_OUTPATIENT_CLINIC_OR_DEPARTMENT_OTHER): Payer: Self-pay

## 2022-02-23 IMAGING — CT CT ABD-PELV W/ CM
2 of 5 series · 16 of 46 positions shown, 18 images · IV contrast (Omnipaque)
Comparison: None.

CLINICAL DATA: Chronic intermittent right lower quadrant pain.

EXAM:
CT ABDOMEN AND PELVIS WITH CONTRAST
TECHNIQUE: Multidetector CT imaging of the abdomen and pelvis was performed
using the standard protocol following bolus administration of
intravenous contrast.
CONTRAST:  100mL OMNIPAQUE IOHEXOL 300 MG/ML  SOLN

[Series 2: axial st · axial · 0.77mm/px · z∈[-458,-52]mm · 13 of 91 slices shown, 15 images]
[im 5/91  soft-tissue]
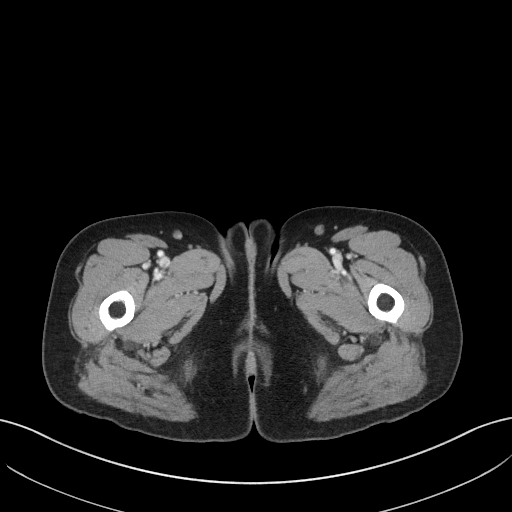
[im 5/91  bone]
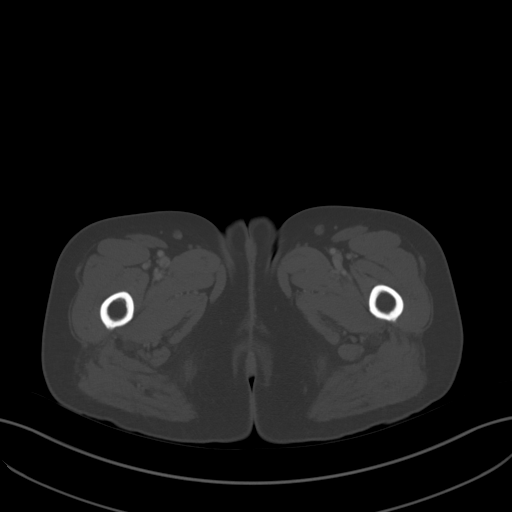
[im 15/91  soft-tissue]
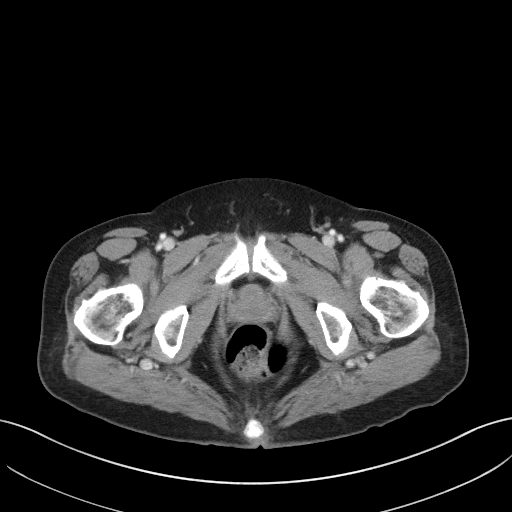
[im 19/91  soft-tissue]
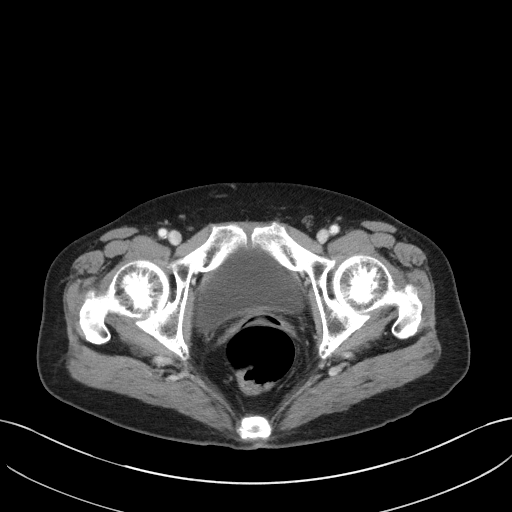
[im 24/91  soft-tissue]
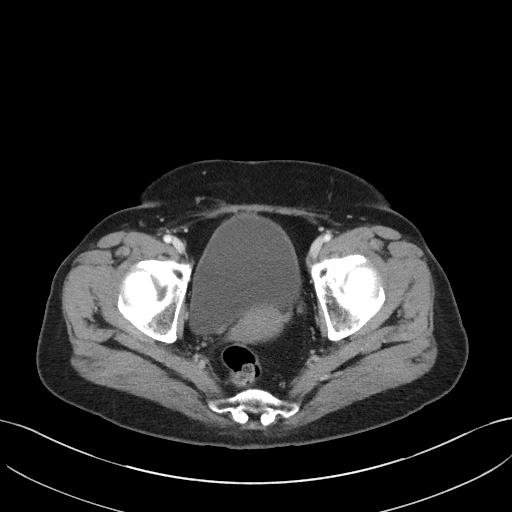
[im 34/91  soft-tissue]
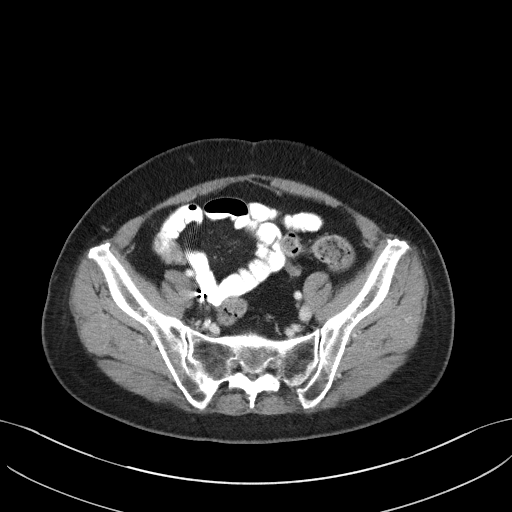
[im 38/91  soft-tissue]
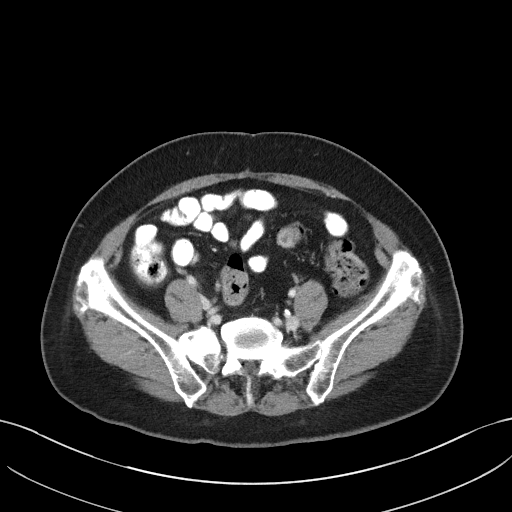
[im 48/91  soft-tissue]
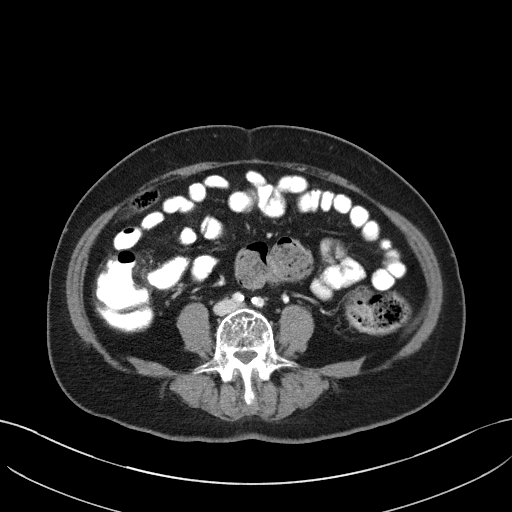
[im 53/91  soft-tissue]
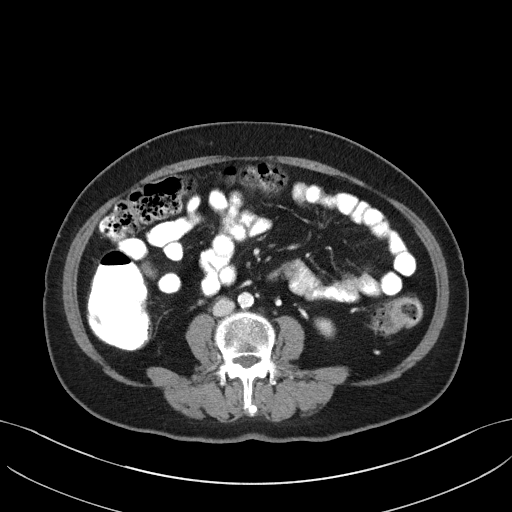
[im 57/91  soft-tissue]
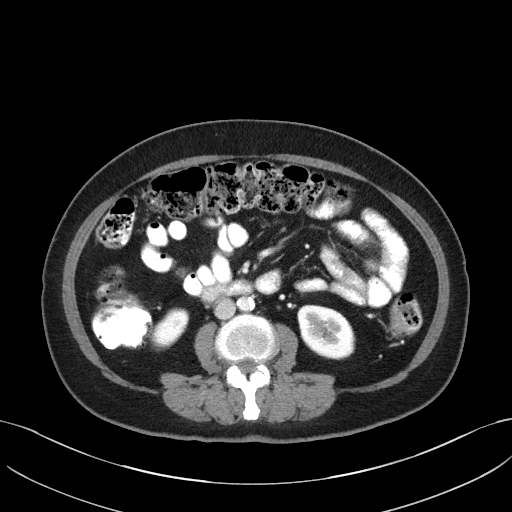
[im 57/91  bone]
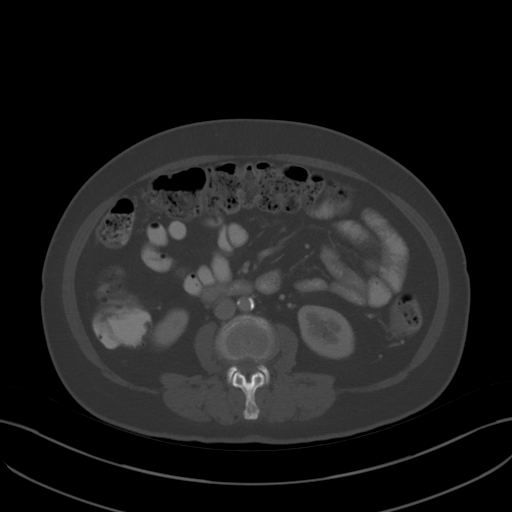
[im 67/91  soft-tissue]
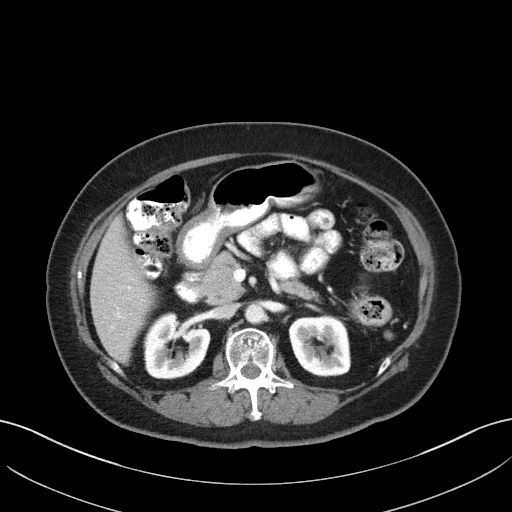
[im 72/91  soft-tissue]
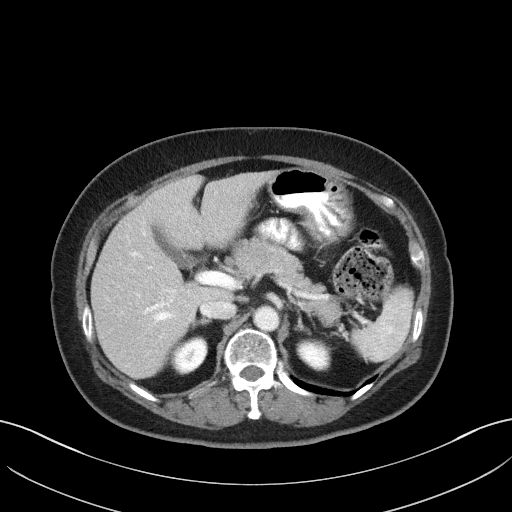
[im 76/91  soft-tissue]
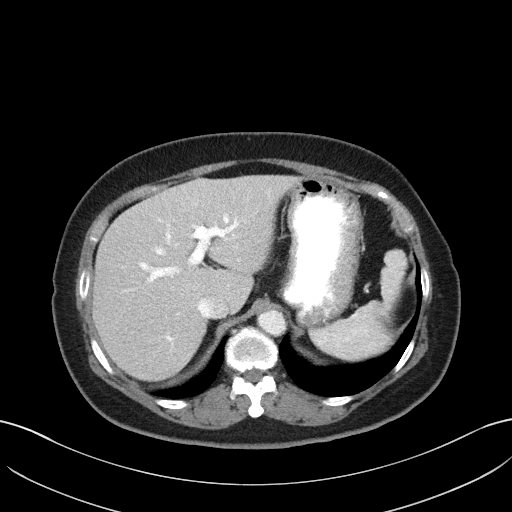
[im 86/91  soft-tissue]
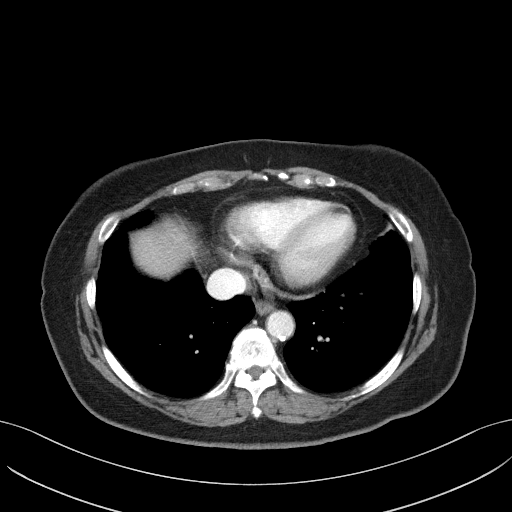

[Series 5: coronal st · coronal · 0.72mm/px · 3 of 85 slices shown]
[im 29/85  soft-tissue]
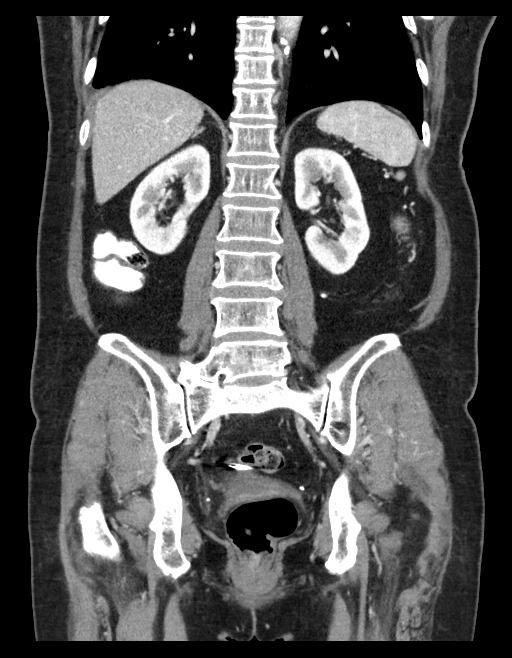
[im 38/85  soft-tissue]
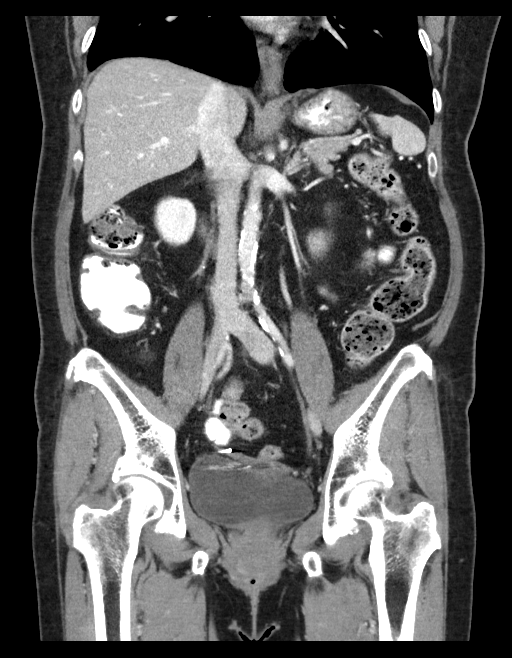
[im 47/85  soft-tissue]
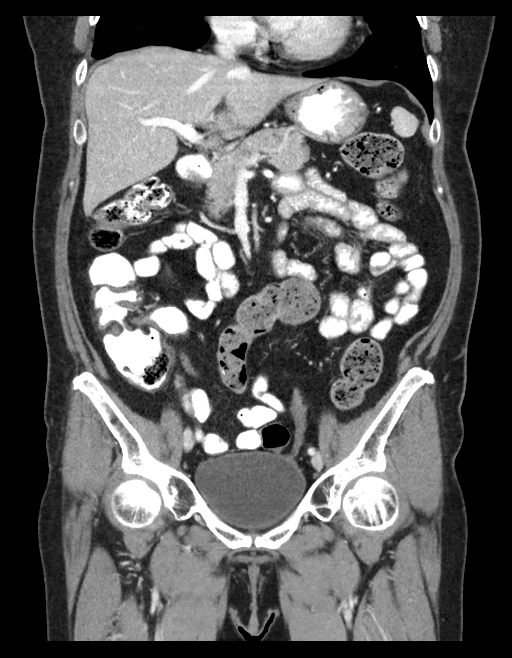

[16 of 46 positions shown; findings below may reference images not displayed]

FINDINGS: Lower chest: Right lower lobe scarring. Lingular and right middle
lobe atelectasis. Normal size heart. No significant pericardial
effusion/thickening.

Hepatobiliary: No suspicious hepatic lesion. Gallbladder is
unremarkable. No biliary ductal dilation.

Pancreas: Unremarkable. No pancreatic ductal dilatation or
surrounding inflammatory changes.

Spleen: Normal in size without focal abnormality.

Adrenals/Urinary Tract: Adrenal glands are unremarkable. Kidneys are
normal, without renal calculi, focal lesion, or hydronephrosis.
Bladder is unremarkable.

Stomach/Bowel: Radiopaque enteric contrast visualized to the level
of the ascending colon. Stomach is grossly unremarkable. Small
duodenal diverticulum. Normal positioning of the duodenum/ligament
of [place]. No suspicious small bowel wall thickening or dilation.
The terminal ileum and appendix are unremarkable. No suspicious
colonic wall thickening or mass like lesions.

Vascular/Lymphatic: Aortic atherosclerosis. No enlarged abdominal or
pelvic lymph nodes.

Reproductive: Gas visualized in the vagina. No suspicious adnexal
masses. Surgical clips in the right hemipelvis. Pelvic phleboliths.

Other: No abdominopelvic ascites.

Musculoskeletal: Straightening of the normal lumbar lordosis. Mild
multilevel degenerative changes spine. No acute osseous abnormality.
IMPRESSION: 1. No acute abdominopelvic findings to explain the patient's chronic
right lower quadrant pain. Normal appendix.
2. Gas visualized in the vagina, which may be related to recent
instrumentation.
3. Aortic atherosclerosis.

Aortic Atherosclerosis ([id number]-ZMI.I).

## 2022-03-03 ENCOUNTER — Other Ambulatory Visit (HOSPITAL_BASED_OUTPATIENT_CLINIC_OR_DEPARTMENT_OTHER): Payer: Self-pay

## 2022-03-06 ENCOUNTER — Ambulatory Visit (INDEPENDENT_AMBULATORY_CARE_PROVIDER_SITE_OTHER): Payer: Medicare Other

## 2022-03-06 VITALS — Ht 59.0 in | Wt 130.0 lb

## 2022-03-06 DIAGNOSIS — Z0000 Encounter for general adult medical examination without abnormal findings: Secondary | ICD-10-CM

## 2022-03-06 NOTE — Patient Instructions (Signed)
Haley Walker , ?Thank you for taking time to come for your Medicare Wellness Visit. I appreciate your ongoing commitment to your health goals. Please review the following plan we discussed and let me know if I can assist you in the future.  ? ?Screening recommendations/referrals: ?Colonoscopy: completed 06/17/2017, due 06/18/2027 ?Mammogram: completed 11/04/2021, due 11/05/2022 ?Bone Density: completed 04/22/2021 ?Recommended yearly ophthalmology/optometry visit for glaucoma screening and checkup ?Recommended yearly dental visit for hygiene and checkup ? ?Vaccinations: ?Influenza vaccine: due next flu season ?Pneumococcal vaccine: due ?Tdap vaccine: completed 10/04/2015, due 10/03/2025 ?Shingles vaccine: discussed   ?Covid-19: 04/08/2021, 02/21/2020, 01/31/2020 ? ?Advanced directives: Advance directive discussed with you today.  ? ?Conditions/risks identified: none ? ?Next appointment: Follow up in one year for your annual wellness visit  ? ? ?Preventive Care 69 Years and Older, Female ?Preventive care refers to lifestyle choices and visits with your health care provider that can promote health and wellness. ?What does preventive care include? ?A yearly physical exam. This is also called an annual well check. ?Dental exams once or twice a year. ?Routine eye exams. Ask your health care provider how often you should have your eyes checked. ?Personal lifestyle choices, including: ?Daily care of your teeth and gums. ?Regular physical activity. ?Eating a healthy diet. ?Avoiding tobacco and drug use. ?Limiting alcohol use. ?Practicing safe sex. ?Taking low-dose aspirin every day. ?Taking vitamin and mineral supplements as recommended by your health care provider. ?What happens during an annual well check? ?The services and screenings done by your health care provider during your annual well check will depend on your age, overall health, lifestyle risk factors, and family history of disease. ?Counseling  ?Your health care provider may  ask you questions about your: ?Alcohol use. ?Tobacco use. ?Drug use. ?Emotional well-being. ?Home and relationship well-being. ?Sexual activity. ?Eating habits. ?History of falls. ?Memory and ability to understand (cognition). ?Work and work Astronomer. ?Reproductive health. ?Screening  ?You may have the following tests or measurements: ?Height, weight, and BMI. ?Blood pressure. ?Lipid and cholesterol levels. These may be checked every 5 years, or more frequently if you are over 36 years old. ?Skin check. ?Lung cancer screening. You may have this screening every year starting at age 6 if you have a 30-pack-year history of smoking and currently smoke or have quit within the past 15 years. ?Fecal occult blood test (FOBT) of the stool. You may have this test every year starting at age 65. ?Flexible sigmoidoscopy or colonoscopy. You may have a sigmoidoscopy every 5 years or a colonoscopy every 10 years starting at age 17. ?Hepatitis C blood test. ?Hepatitis B blood test. ?Sexually transmitted disease (STD) testing. ?Diabetes screening. This is done by checking your blood sugar (glucose) after you have not eaten for a while (fasting). You may have this done every 1-3 years. ?Bone density scan. This is done to screen for osteoporosis. You may have this done starting at age 40. ?Mammogram. This may be done every 1-2 years. Talk to your health care provider about how often you should have regular mammograms. ?Talk with your health care provider about your test results, treatment options, and if necessary, the need for more tests. ?Vaccines  ?Your health care provider may recommend certain vaccines, such as: ?Influenza vaccine. This is recommended every year. ?Tetanus, diphtheria, and acellular pertussis (Tdap, Td) vaccine. You may need a Td booster every 10 years. ?Zoster vaccine. You may need this after age 30. ?Pneumococcal 13-valent conjugate (PCV13) vaccine. One dose is recommended after age 34. ?Pneumococcal  polysaccharide (PPSV23) vaccine. One dose is recommended after age 94. ?Talk to your health care provider about which screenings and vaccines you need and how often you need them. ?This information is not intended to replace advice given to you by your health care provider. Make sure you discuss any questions you have with your health care provider. ?Document Released: 12/20/2015 Document Revised: 08/12/2016 Document Reviewed: 09/24/2015 ?Elsevier Interactive Patient Education ? 2017 [place]. ? ?Fall Prevention in the Home ?Falls can cause injuries. They can happen to people of all ages. There are many things you can do to make your home safe and to help prevent falls. ?What can I do on the outside of my home? ?Regularly fix the edges of walkways and driveways and fix any cracks. ?Remove anything that might make you trip as you walk through a door, such as a raised step or threshold. ?Trim any bushes or trees on the path to your home. ?Use bright outdoor lighting. ?Clear any walking paths of anything that might make someone trip, such as rocks or tools. ?Regularly check to see if handrails are loose or broken. Make sure that both sides of any steps have handrails. ?Any raised decks and porches should have guardrails on the edges. ?Have any leaves, snow, or ice cleared regularly. ?Use sand or salt on walking paths during winter. ?Clean up any spills in your garage right away. This includes oil or grease spills. ?What can I do in the bathroom? ?Use night lights. ?Install grab bars by the toilet and in the tub and shower. Do not use towel bars as grab bars. ?Use non-skid mats or decals in the tub or shower. ?If you need to sit down in the shower, use a plastic, non-slip stool. ?Keep the floor dry. Clean up any water that spills on the floor as soon as it happens. ?Remove soap buildup in the tub or shower regularly. ?Attach bath mats securely with double-sided non-slip rug tape. ?Do not have throw rugs and other  things on the floor that can make you trip. ?What can I do in the bedroom? ?Use night lights. ?Make sure that you have a light by your bed that is easy to reach. ?Do not use any sheets or blankets that are too big for your bed. They should not hang down onto the floor. ?Have a firm chair that has side arms. You can use this for support while you get dressed. ?Do not have throw rugs and other things on the floor that can make you trip. ?What can I do in the kitchen? ?Clean up any spills right away. ?Avoid walking on wet floors. ?Keep items that you use a lot in easy-to-reach places. ?If you need to reach something above you, use a strong step stool that has a grab bar. ?Keep electrical cords out of the way. ?Do not use floor polish or wax that makes floors slippery. If you must use wax, use non-skid floor wax. ?Do not have throw rugs and other things on the floor that can make you trip. ?What can I do with my stairs? ?Do not leave any items on the stairs. ?Make sure that there are handrails on both sides of the stairs and use them. Fix handrails that are broken or loose. Make sure that handrails are as long as the stairways. ?Check any carpeting to make sure that it is firmly attached to the stairs. Fix any carpet that is loose or worn. ?Avoid having throw rugs at the top or  bottom of the stairs. If you do have throw rugs, attach them to the floor with carpet tape. ?Make sure that you have a light switch at the top of the stairs and the bottom of the stairs. If you do not have them, ask someone to add them for you. ?What else can I do to help prevent falls? ?Wear shoes that: ?Do not have high heels. ?Have rubber bottoms. ?Are comfortable and fit you well. ?Are closed at the toe. Do not wear sandals. ?If you use a stepladder: ?Make sure that it is fully opened. Do not climb a closed stepladder. ?Make sure that both sides of the stepladder are locked into place. ?Ask someone to hold it for you, if possible. ?Clearly  mark and make sure that you can see: ?Any grab bars or handrails. ?First and last steps. ?Where the edge of each step is. ?Use tools that help you move around (mobility aids) if they are needed. These include: ?

## 2022-03-06 NOTE — Progress Notes (Signed)
?I connected with [name] today by telephone and verified that I am speaking with the correct person using two identifiers. ?Location patient: home ?Location provider: work ?Persons participating in the virtual visit: Anjanette Parlier, Glenna Durand LPN. ?  ?I discussed the limitations, risks, security and privacy concerns of performing an evaluation and management service by telephone and the availability of in person appointments. I also discussed with the patient that there may be a patient responsible charge related to this service. The patient expressed understanding and verbally consented to this telephonic visit.  ?  ?Interactive audio and video telecommunications were attempted between this provider and patient, however failed, due to patient having technical difficulties OR patient did not have access to video capability.  We continued and completed visit with audio only. ? ?  ? ?Vital signs may be patient reported or missing. ? ?Subjective:  ? Haley Walker is a 71 y.o. female who presents for an Initial Medicare Annual Wellness Visit. ? ?Review of Systems    ? ?Cardiac Risk Factors include: advanced age (>45men, >51 women) ? ?   ?Objective:  ?  ?Today's Vitals  ? 03/06/22 0911  ?Weight: 130 lb (59 kg)  ?Height: 4\' 11"  (1.499 m)  ? ?Body mass index is 26.26 kg/m?. ? ? ?  03/06/2022  ?  9:16 AM  ?Advanced Directives  ?Does Patient Have a Medical Advance Directive? No  ? ? ?Current Medications (verified) ?Outpatient Encounter Medications as of 03/06/2022  ?Medication Sig  ? Glucosamine-Chondroitin (GLUCOSAMINE CHONDR COMPLEX PO) Take by mouth.  ? Multiple Vitamin (MULTIVITAMIN ADULT PO) Take by mouth.  ? pantoprazole (PROTONIX) 40 MG tablet Take 1 tablet (40 mg total) by mouth daily.  ? simvastatin (ZOCOR) 40 MG tablet Take 1 tablet (40 mg total) by mouth daily at 6 PM.  ? COVID-19 mRNA Vac-TriS, Pfizer, (PFIZER-BIONT COVID-19 VAC-TRIS) SUSP injection Inject into the muscle.  ? ?No facility-administered encounter  medications on file as of 03/06/2022.  ? ? ?Allergies (verified) ?Patient has no known allergies.  ? ?History: ?Past Medical History:  ?Diagnosis Date  ? Acid reflux   ? ?Past Surgical History:  ?Procedure Laterality Date  ? CESAREAN SECTION    ? 3 times  ? ?Family History  ?Problem Relation Age of Onset  ? Alzheimer's disease Mother   ? Alzheimer's disease Father   ? ?Social History  ? ?Socioeconomic History  ? Marital status: Married  ?  Spouse name: Not on file  ? Number of children: Not on file  ? Years of education: Not on file  ? Highest education level: Not on file  ?Occupational History  ? Not on file  ?Tobacco Use  ? Smoking status: Never  ? Smokeless tobacco: Never  ?Vaping Use  ? Vaping Use: Never used  ?Substance and Sexual Activity  ? Alcohol use: Never  ?  Alcohol/week: 0.0 standard drinks  ? Drug use: Never  ? Sexual activity: Not on file  ?Other Topics Concern  ? Not on file  ?Social History Narrative  ? Not on file  ? ?Social Determinants of Health  ? ?Financial Resource Strain: Low Risk   ? Difficulty of Paying Living Expenses: Not hard at all  ?Food Insecurity: No Food Insecurity  ? Worried About Charity fundraiser in the Last Year: Never true  ? Ran Out of Food in the Last Year: Never true  ?Transportation Needs: No Transportation Needs  ? Lack of Transportation (Medical): No  ? Lack of Transportation (Non-Medical):  No  ?Physical Activity: Inactive  ? Days of Exercise per Week: 0 days  ? Minutes of Exercise per Session: 0 min  ?Stress: No Stress Concern Present  ? Feeling of Stress : Only a little  ?Social Connections: Not on file  ? ? ?Tobacco Counseling ?Counseling given: Not Answered ? ? ?Clinical Intake: ? ?Pre-visit preparation completed: Yes ? ?Pain : No/denies pain ? ?  ? ?Nutritional Status: BMI 25 -29 Overweight ?Nutritional Risks: None ?Diabetes: No ? ?How often do you need to have someone help you when you read instructions, pamphlets, or other written materials from your doctor or  pharmacy?: 1 - Never ?What is the last grade level you completed in school?: 12th grade ? ?Diabetic? no ? ?Interpreter Needed?: No ? ?Information entered by :: NAllen LPN ? ? ?Activities of Daily Living ? ?  03/06/2022  ?  9:19 AM  ?In your present state of health, do you have any difficulty performing the following activities:  ?Hearing? 0  ?Vision? 0  ?Comment hard to see small letters  ?Difficulty concentrating or making decisions? 0  ?Walking or climbing stairs? 0  ?Dressing or bathing? 0  ?Doing errands, shopping? 0  ?Preparing Food and eating ? N  ?Using the Toilet? N  ?In the past six months, have you accidently leaked urine? N  ?Do you have problems with loss of bowel control? N  ?Managing your Medications? N  ?Managing your Finances? N  ?Housekeeping or managing your Housekeeping? N  ? ? ?Patient Care Team: ?Shelda Pal, DO as PCP - General (Family Medicine) ? ?Indicate any recent Medical Services you may have received from other than [facility] providers in the past year (date may be approximate). ? ?   ?Assessment:  ? This is a routine wellness examination for Haley Walker. ? ?Hearing/Vision screen ?Vision Screening - Comments:: No regular eye exams ? ?Dietary issues and exercise activities discussed: ?Current Exercise Habits: The patient does not participate in regular exercise at present ? ? Goals Addressed   ? ?  ?  ?  ?  ? This Visit's Progress  ?  Patient Stated     ?  03/06/2022, no goal ?  ? ?  ? ?Depression Screen ? ?  03/06/2022  ?  9:18 AM 02/19/2021  ?  8:59 AM  ?PHQ 2/9 Scores  ?PHQ - 2 Score 0 0  ?PHQ- 9 Score 0   ?  ?Fall Risk ? ?  03/06/2022  ?  9:17 AM 12/15/2021  ?  3:32 PM  ?Fall Risk   ?Falls in the past year? 0 0  ?Number falls in past yr: 0 0  ?Injury with Fall? 0 0  ?Risk for fall due to : Medication side effect   ?Follow up Falls evaluation completed;Education provided;Falls prevention discussed   ? ? ?FALL RISK PREVENTION PERTAINING TO THE HOME: ? ?Any stairs in or around the home? Yes  ?If  so, are there any without handrails? No  ?Home free of loose throw rugs in walkways, pet beds, electrical cords, etc? Yes  ?Adequate lighting in your home to reduce risk of falls? Yes  ? ?ASSISTIVE DEVICES UTILIZED TO PREVENT FALLS: ? ?Life alert? No  ?Use of a cane, walker or w/c? No  ?Grab bars in the bathroom? No  ?Shower chair or bench in shower? No  ?Elevated toilet seat or a handicapped toilet? No  ? ?TIMED UP AND GO: ? ?Was the test performed? No .  ? ? ? ? ?  Cognitive Function: ?  ?  ? ?  03/06/2022  ?  9:22 AM  ?6CIT Screen  ?What Year? 0 points  ?What month? 0 points  ?What time? 0 points  ?Count back from 20 0 points  ?Months in reverse 0 points  ?Repeat phrase 8 points  ?Total Score 8 points  ? ? ?Immunizations ?Immunization History  ?Administered Date(s) Administered  ? Fluad Quad(high Dose 65+) 09/24/2021  ? Influenza, Seasonal, Injecte, Preservative Fre 10/04/2015  ? Influenza,inj,Quad PF,6+ Mos 08/22/2019, 09/11/2020  ? Influenza,inj,Quad PF,6-35 Mos 10/15/2016  ? Influenza,inj,quad, With Preservative 10/04/2015  ? Influenza-Unspecified 10/15/2016, 01/23/2019  ? PFIZER Comirnaty(Gray Top)Covid-19 Tri-Sucrose Vaccine 04/08/2021  ? PFIZER(Purple Top)SARS-COV-2 Vaccination 01/31/2020, 02/21/2020  ? Pneumococcal Polysaccharide-23 08/29/2020  ? Tdap 10/04/2015  ? ? ?TDAP status: Up to date ? ?Flu Vaccine status: Up to date ? ?Pneumococcal vaccine status: Due, Education has been provided regarding the importance of this vaccine. Advised may receive this vaccine at local pharmacy or Health Dept. Aware to provide a copy of the vaccination record if obtained from local pharmacy or Health Dept. Verbalized acceptance and understanding. ? ?Covid-19 vaccine status: Completed vaccines ? ?Qualifies for Shingles Vaccine? Yes   ?Zostavax completed No   ?Shingrix Completed?: No.    Education has been provided regarding the importance of this vaccine. Patient has been advised to call insurance company to determine out of  pocket expense if they have not yet received this vaccine. Advised may also receive vaccine at local pharmacy or Health Dept. Verbalized acceptance and understanding. ? ?Screening Tests ?Health Maintenance  ?

## 2022-04-01 ENCOUNTER — Encounter: Payer: Medicare Other | Admitting: Family Medicine

## 2022-04-07 ENCOUNTER — Encounter: Payer: Self-pay | Admitting: Family Medicine

## 2022-04-07 ENCOUNTER — Ambulatory Visit (INDEPENDENT_AMBULATORY_CARE_PROVIDER_SITE_OTHER): Payer: Medicare Other | Admitting: Family Medicine

## 2022-04-07 ENCOUNTER — Other Ambulatory Visit (HOSPITAL_BASED_OUTPATIENT_CLINIC_OR_DEPARTMENT_OTHER): Payer: Self-pay

## 2022-04-07 VITALS — BP 120/74 | HR 56 | Temp 98.3°F | Ht 59.0 in | Wt 128.5 lb

## 2022-04-07 DIAGNOSIS — M25532 Pain in left wrist: Secondary | ICD-10-CM

## 2022-04-07 DIAGNOSIS — E785 Hyperlipidemia, unspecified: Secondary | ICD-10-CM

## 2022-04-07 DIAGNOSIS — Z23 Encounter for immunization: Secondary | ICD-10-CM | POA: Diagnosis not present

## 2022-04-07 DIAGNOSIS — Z0001 Encounter for general adult medical examination with abnormal findings: Secondary | ICD-10-CM | POA: Diagnosis not present

## 2022-04-07 DIAGNOSIS — Z0000 Encounter for general adult medical examination without abnormal findings: Secondary | ICD-10-CM

## 2022-04-07 LAB — COMPREHENSIVE METABOLIC PANEL
ALT: 22 U/L (ref 0–35)
AST: 25 U/L (ref 0–37)
Albumin: 4.4 g/dL (ref 3.5–5.2)
Alkaline Phosphatase: 44 U/L (ref 39–117)
BUN: 13 mg/dL (ref 6–23)
CO2: 31 mEq/L (ref 19–32)
Calcium: 9.2 mg/dL (ref 8.4–10.5)
Chloride: 104 mEq/L (ref 96–112)
Creatinine, Ser: 1.05 mg/dL (ref 0.40–1.20)
GFR: 54.58 mL/min — ABNORMAL LOW (ref >60.00)
Glucose, Bld: 120 mg/dL — ABNORMAL HIGH (ref 70–99)
Potassium: 4.1 mEq/L (ref 3.5–5.1)
Sodium: 141 mEq/L (ref 135–145)
Total Bilirubin: 0.5 mg/dL (ref 0.2–1.2)
Total Protein: 6.4 g/dL (ref 6.0–8.3)

## 2022-04-07 LAB — LIPID PANEL
Cholesterol: 193 mg/dL (ref 0–200)
HDL: 56 mg/dL (ref >39.00)
LDL Cholesterol: 109 mg/dL — ABNORMAL HIGH (ref 0–99)
NonHDL: 137.29
Total CHOL/HDL Ratio: 3
Triglycerides: 139 mg/dL (ref 0.0–149.0)
VLDL: 27.8 mg/dL (ref 0.0–40.0)

## 2022-04-07 MED ORDER — SIMVASTATIN 40 MG PO TABS
40.0000 mg | ORAL_TABLET | Freq: Every day | ORAL | 2 refills | Status: DC
  Filled 2022-04-07 – 2022-04-29 (×2): qty 90, 90d supply, fill #0

## 2022-04-07 NOTE — Progress Notes (Signed)
Chief Complaint  ?Patient presents with  ? Annual Exam  ? Wrist Pain  ?  Left ?  ?  ? ?Well Woman ?Haley Walker is here for a complete physical.   ?Her last physical was >1 year ago.  ?Current diet: in general, diet is OK. ?Current exercise: none. ?Weight is stable and she denies daytime fatigue. ?Seatbelt? Yes ?Advanced directive? No ? ?Health Maintenance ?Colonoscopy- No ?Shingrix- No ?DEXA- Yes ?Mammogram- Yes ?Tetanus- Yes ?Pneumonia- Due for PCV20 ?Hep C screen- Yes ? ?L wrist pain ?6 mo, happened after lifting something heavy. Not improving. Hurts over base of thumb. No bruising, swelling, redness, neuro s/s's. Has not tried anything at home so far.  ? ?Past Medical History:  ?Diagnosis Date  ? Acid reflux   ?  ? ?Past Surgical History:  ?Procedure Laterality Date  ? CESAREAN SECTION    ? 3 times  ? ? ?Medications  ?Current Outpatient Medications on File Prior to Visit  ?Medication Sig Dispense Refill  ? COVID-19 mRNA Vac-TriS, Pfizer, (PFIZER-BIONT COVID-19 VAC-TRIS) SUSP injection Inject into the muscle. 0.3 mL 0  ? Glucosamine-Chondroitin (GLUCOSAMINE CHONDR COMPLEX PO) Take by mouth.    ? Multiple Vitamin (MULTIVITAMIN ADULT PO) Take by mouth.    ? pantoprazole (PROTONIX) 40 MG tablet Take 1 tablet (40 mg total) by mouth daily. 30 tablet 1  ? simvastatin (ZOCOR) 40 MG tablet Take 1 tablet (40 mg total) by mouth daily at 6 PM. 90 tablet 2  ? ?Allergies ?No Known Allergies ? ?Review of Systems: ?Constitutional:  no fevers ?Eye:  no recent significant change in vision ?Ears:  No changes in hearing ?Nose/Mouth/Throat:  no complaints of nasal congestion, no sore throat ?Cardiovascular: no chest pain ?Respiratory:  No shortness of breath ?Gastrointestinal:  No change in bowel habits ?GU:  Female: negative for dysuria ?Integumentary:  no abnormal skin lesions reported ?Neurologic:  no headaches ?Endocrine:  denies unexplained weight changes ? ?Exam ?BP 120/74   Pulse (!) 56   Temp 98.3 ?F (36.8 ?C) (Oral)   Ht 4'  11" (1.499 m)   Wt 128 lb 8 oz (58.3 kg)   SpO2 99%   BMI 25.95 kg/m?  ?General:  well developed, well nourished, in no apparent distress ?Skin:  no significant moles, warts, or growths ?Head:  no masses, lesions, or tenderness ?Eyes:  pupils equal and round, sclera anicteric without injection ?Ears:  canals without lesions, TMs shiny without retraction, no obvious effusion, no erythema ?Nose:  nares patent, septum midline, mucosa normal, and no drainage or sinus tenderness ?Throat/Pharynx:  lips and gingiva without lesion; tongue and uvula midline; non-inflamed pharynx; no exudates or postnasal drainage ?Neck: neck supple without adenopathy, thyromegaly, or masses ?Lungs:  clear to auscultation, breath sounds equal bilaterally, no respiratory distress ?Cardio:  regular rate and rhythm, no bruits or LE edema ?Abdomen:  abdomen soft, nontender; bowel sounds normal; no masses or organomegaly ?Genital: Deferred ?Neuro:  gait normal; deep tendon reflexes normal and symmetric ?MSK: Mild ttp over extens pollicis brev on L, neg Finkelstein's ?Psych: well oriented with normal range of affect and appropriate judgment/insight ? ?Assessment and Plan ? ?Well adult exam ? ?Hyperlipidemia, unspecified hyperlipidemia type - Plan: Comprehensive metabolic panel, Lipid panel ? ?Left wrist pain ? ?Need for vaccination against Streptococcus pneumoniae - Plan: Pneumococcal conjugate vaccine 20-valent (Prevnar 20)  ? ?Well 71 y.o. female. ?Counseled on diet and exercise. ?Other orders as above. ?L wrist pain: Seems like a tendinopathy- thumb spica rec'd w stretches/exercises,  ice, Tylenol. ?Advanced directive form provided today.   ?Follow up in 6 mo. ?The patient voiced understanding and agreement to the plan. ? ?Sharlene Dory, DO ?04/07/22 ?2:19 PM ? ?

## 2022-04-07 NOTE — Patient Instructions (Addendum)
Give us 2-3 business days to get the results of your labs back.  ? ?Keep the diet clean and stay active. ? ?Please get me a copy of your advanced directive form at your convenience.  ? ?The Shingrix vaccine (for shingles) is a 2 shot series spaced 2-6 months apart. It can make people feel low energy, achy and almost like they have the flu for 48 hours after injection. 1/5 people can have nausea and/or vomiting. Please plan accordingly when deciding on when to get this shot. Call your pharmacy to get this. The second shot of the series is less severe regarding the side effects, but it still lasts 48 hours.  ? ?Please consider a thumb spica splint to wear at night and during aggravating ativities during the day.  ? ?Let us know if you need anything. ? ?Wrist and Forearm Exercises ?Do exercises exactly as told by your health care provider and adjust them as directed. It is normal to feel mild stretching, pulling, tightness, or discomfort as you do these exercises, but you should stop right away if you feel sudden pain or your pain gets worse.  ? ?RANGE OF MOTION EXERCISES ?These exercises warm up your muscles and joints and improve the movement and flexibility of your injured wrist and forearm. These exercises also help to relieve pain, numbness, and tingling. These exercises are done using the muscles in your injured wrist and forearm. ?Exercise A: Wrist Flexion, Active ?With your fingers relaxed, bend your wrist forward as far as you can. ?Hold this position for 30 seconds. ?Repeat 2 times. Complete this exercise 3 times per week. ?Exercise B: Wrist Extension, Active ?With your fingers relaxed, bend your wrist backward as far as you can. ?Hold this position for 30 seconds. ?Repeat 2 times. Complete this exercise 3 times per week. ?Exercise C: Supination, Active ? ?Stand or sit with your arms at your sides. ?Bend your left / right elbow to an "L" shape (90 degrees). ?Turn your palm upward until you feel a gentle  stretch on the inside of your forearm. ?Hold this position for 30 seconds. ?Slowly return your palm to the starting position. ?Repeat 2 times. Complete this exercise 3 times per week. ?Exercise D: Pronation, Active ? ?Stand or sit with your arms at your sides. ?Bend your left / right elbow to an "L" shape (90 degrees). ?Turn your palm downward until you feel a gentle stretch on the top of your forearm. ?Hold this position for 30 seconds. ?Slowly return your palm to the starting position. ?Repeat 2 times. Complete this exercise once a day. ? ?STRETCHING EXERCISES ?These exercises warm up your muscles and joints and improve the movement and flexibility of your injured wrist and forearm. These exercises also help to relieve pain, numbness, and tingling. These exercises are done using your healthy wrist and forearm to help stretch the muscles in your injured wrist and forearm. ?Exercise E: Wrist Flexion, Passive ? ?Extend your left / right arm in front of you, relax your wrist, and point your fingers downward. ?Gently push on the back of your hand. Stop when you feel a gentle stretch on the top of your forearm. ?Hold this position for 30 seconds. ?Repeat 2 times. Complete this exercise 3 times per week. ?Exercise F: Wrist Extension, Passive ? ?Extend your left / right arm in front of you and turn your palm upward. ?Gently pull your palm and fingertips back so your fingers point downward. You should feel a gentle stretch on the  palm-side of your forearm. ?Hold this position for 30 seconds. ?Repeat 2 times. Complete this exercise 3 times per week. ?Exercise G: Forearm Rotation, Supination, Passive ?Sit with your left / right elbow bent to an "L" shape (90 degrees) with your forearm resting on a table. ?Keeping your upper body and shoulder still, use your other hand to rotate your forearm palm-up until you feel a gentle to moderate stretch. ?Hold this position for 30 seconds. ?Slowly release the stretch and return to the  starting position. ?Repeat 2 times. Complete this exercise 3 times per week. ?Exercise H: Forearm Rotation, Pronation, Passive ?Sit with your left / right elbow bent to an "L" shape (90 degrees) with your forearm resting on a table. ?Keeping your upper body and shoulder still, use your other hand to rotate your forearm palm-down until you feel a gentle to moderate stretch. ?Hold this position for 30 seconds. ?Slowly release the stretch and return to the starting position. ?Repeat 2 times. Complete this exercise 3 times per week. ? ?STRENGTHENING EXERCISES ?These exercises build strength and endurance in your wrist and forearm. Endurance is the ability to use your muscles for a long time, even after they get tired. ?Exercise I: Wrist Flexors ? ?Sit with your left / right forearm supported on a table and your hand resting palm-up over the edge of the table. Your elbow should be bent to an "L" shape (about 90 degrees) and be below the level of your shoulder. ?Hold a 3-5 lb weight in your left / right hand. Or, hold a rubber exercise band or tube in both hands, keeping your hands at the same level and hip distance apart. There should be a slight tension in the exercise band or tube. ?Slowly curl your hand up toward your forearm. ?Hold this position for 3 seconds. ?Slowly lower your hand back to the starting position. ?Repeat 2 times. Complete this exercise 3 times per week. ?Exercise J: Wrist Extensors ? ?Sit with your left / right forearm supported on a table and your hand resting palm-down over the edge of the table. Your elbow should be bent to an "L" shape (about 90 degrees) and be below the level of your shoulder. ?Hold a 3-5 lb weight in your left / right hand. Or, hold a rubber exercise band or tube in both hands, keeping your hands at the same level and hip distance apart. There should be a slight tension in the exercise band or tube. ?Slowly curl your hand up toward your forearm. ?Hold this position for 3  seconds. ?Slowly lower your hand back to the starting position. ?Repeat 2 times. Complete this exercise 3 times per week. ?Exercise K: Forearm Rotation, Supination ? ?Sit with your left / right forearm supported on a table and your hand resting palm-down. Your elbow should be at your side, bent to an "L" shape (about 90 degrees), and below the level of your shoulder. Keep your wrist stable and in a neutral position throughout the exercise. ?Gently hold a lightweight hammer with your left / right hand. ?Without moving your elbow or wrist, slowly rotate your palm upward to a thumbs-up position. ?Hold this position for 3 seconds. ?Slowly return your forearm to the starting position. ?Repeat 2 times. Complete this exercise 3 times per week. ?Exercise L: Forearm Rotation, Pronation ? ?Sit with your left / right forearm supported on a table and your hand resting palm-up. Your elbow should be at your side, bent to an "L" shape (about 90 degrees),  and below the level of your shoulder. Keep your wrist stable. Do not allow it to move backward or forward during the exercise. ?Gently hold a lightweight hammer with your left / right hand. ?Without moving your elbow or wrist, slowly rotate your palm and hand upward to a thumbs-up position. ?Hold this position for 3 seconds. ?Slowly return your forearm to the starting position. ?Repeat 2 times. Complete this exercise 3 times per week. ?Exercise M: Grip Strengthening ? ?Hold one of these items in your left / right hand: play dough, therapy putty, a dense sponge, a stress ball, or a large, rolled sock. ?Squeeze as hard as you can without increasing pain. ?Hold this position for 5 seconds. ?Slowly release your grip. ?Repeat 2 times. Complete this exercise 3 times per week. ? ?This information is not intended to replace advice given to you by your health care provider. Make sure you discuss any questions you have with your health care provider. ?Document Released: 10/07/2005 Document  Revised: 08/17/2016 Document Reviewed: 08/18/2015 ?Elsevier Interactive Patient Education ? 2018 [employer] ? ?Gluteus Medius Syndrome Rehab ?It is normal to feel mild stretching, pulling, tightness, or disco

## 2022-04-17 ENCOUNTER — Other Ambulatory Visit (HOSPITAL_BASED_OUTPATIENT_CLINIC_OR_DEPARTMENT_OTHER): Payer: Self-pay

## 2022-04-29 ENCOUNTER — Other Ambulatory Visit (HOSPITAL_BASED_OUTPATIENT_CLINIC_OR_DEPARTMENT_OTHER): Payer: Self-pay

## 2022-04-29 ENCOUNTER — Other Ambulatory Visit: Payer: Self-pay | Admitting: Family Medicine

## 2022-04-29 DIAGNOSIS — K219 Gastro-esophageal reflux disease without esophagitis: Secondary | ICD-10-CM

## 2022-04-29 MED ORDER — PANTOPRAZOLE SODIUM 40 MG PO TBEC
40.0000 mg | DELAYED_RELEASE_TABLET | Freq: Every day | ORAL | 1 refills | Status: DC
  Filled 2022-04-29: qty 30, 30d supply, fill #0

## 2022-04-30 ENCOUNTER — Other Ambulatory Visit (HOSPITAL_BASED_OUTPATIENT_CLINIC_OR_DEPARTMENT_OTHER): Payer: Self-pay

## 2022-05-26 ENCOUNTER — Other Ambulatory Visit: Payer: Self-pay | Admitting: Family Medicine

## 2022-05-26 ENCOUNTER — Other Ambulatory Visit (HOSPITAL_BASED_OUTPATIENT_CLINIC_OR_DEPARTMENT_OTHER): Payer: Self-pay

## 2022-05-26 DIAGNOSIS — K219 Gastro-esophageal reflux disease without esophagitis: Secondary | ICD-10-CM

## 2022-05-26 MED ORDER — PANTOPRAZOLE SODIUM 40 MG PO TBEC
40.0000 mg | DELAYED_RELEASE_TABLET | Freq: Every day | ORAL | 2 refills | Status: DC
  Filled 2022-05-26: qty 90, 90d supply, fill #0
  Filled 2022-09-30: qty 90, 90d supply, fill #1

## 2022-06-01 ENCOUNTER — Other Ambulatory Visit (HOSPITAL_BASED_OUTPATIENT_CLINIC_OR_DEPARTMENT_OTHER): Payer: Self-pay

## 2022-06-01 ENCOUNTER — Ambulatory Visit (INDEPENDENT_AMBULATORY_CARE_PROVIDER_SITE_OTHER): Payer: Medicare Other | Admitting: Family Medicine

## 2022-06-01 ENCOUNTER — Encounter: Payer: Self-pay | Admitting: Family Medicine

## 2022-06-01 VITALS — BP 110/68 | HR 69 | Temp 97.1°F | Ht 59.0 in | Wt 128.8 lb

## 2022-06-01 DIAGNOSIS — T7840XA Allergy, unspecified, initial encounter: Secondary | ICD-10-CM | POA: Diagnosis not present

## 2022-06-01 MED ORDER — PREDNISONE 20 MG PO TABS
20.0000 mg | ORAL_TABLET | Freq: Two times a day (BID) | ORAL | 0 refills | Status: AC
  Filled 2022-06-01: qty 14, 7d supply, fill #0

## 2022-06-01 MED ORDER — CETIRIZINE HCL 10 MG PO TABS
10.0000 mg | ORAL_TABLET | Freq: Every day | ORAL | 0 refills | Status: AC
  Filled 2022-06-01: qty 100, 100d supply, fill #0

## 2022-09-30 ENCOUNTER — Other Ambulatory Visit (HOSPITAL_BASED_OUTPATIENT_CLINIC_OR_DEPARTMENT_OTHER): Payer: Self-pay

## 2022-09-30 MED ORDER — FLUAD QUADRIVALENT 0.5 ML IM PRSY
PREFILLED_SYRINGE | INTRAMUSCULAR | 0 refills | Status: DC
  Filled 2022-09-30: qty 0.5, 1d supply, fill #0

## 2022-11-03 ENCOUNTER — Other Ambulatory Visit (HOSPITAL_BASED_OUTPATIENT_CLINIC_OR_DEPARTMENT_OTHER): Payer: Self-pay

## 2022-11-03 ENCOUNTER — Ambulatory Visit (INDEPENDENT_AMBULATORY_CARE_PROVIDER_SITE_OTHER): Payer: Medicare Other | Admitting: Family Medicine

## 2022-11-03 ENCOUNTER — Encounter: Payer: Self-pay | Admitting: Family Medicine

## 2022-11-03 VITALS — BP 118/64 | HR 64 | Temp 98.0°F | Ht 59.0 in | Wt 126.0 lb

## 2022-11-03 DIAGNOSIS — E041 Nontoxic single thyroid nodule: Secondary | ICD-10-CM | POA: Diagnosis not present

## 2022-11-03 DIAGNOSIS — K219 Gastro-esophageal reflux disease without esophagitis: Secondary | ICD-10-CM | POA: Diagnosis not present

## 2022-11-03 MED ORDER — PANTOPRAZOLE SODIUM 40 MG PO TBEC
40.0000 mg | DELAYED_RELEASE_TABLET | Freq: Two times a day (BID) | ORAL | 2 refills | Status: DC
  Filled 2022-11-03 – 2022-11-24 (×2): qty 180, 90d supply, fill #0

## 2022-11-03 NOTE — Progress Notes (Signed)
Chief Complaint  Patient presents with   Follow-up    Subjective: Patient is a 71 y.o. female here for f/u.  GERD-over the past 2 months, the patient been having worsening reflux symptoms at nighttime.  She is having burning in her chest.  Protonix helps when she takes in the morning but not her nighttime symptoms.  When she takes an evening dose it seems to help.  She will also take as needed Pepcid.  No difficulty swallowing, bleeding, unintentional weight loss, or fevers.  Thyroid issues-questionable history of a thyroid nodule.  She had a relatively unremarkable ultrasound 6 years ago but would like it checked again.  She is having some hoarseness, though this could be due to the reflux.  Past Medical History:  Diagnosis Date   Acid reflux     Objective: BP 118/64 (BP Location: Left Arm, Patient Position: Sitting, Cuff Size: Normal)   Pulse 64   Temp 98 F (36.7 C) (Oral)   Ht 4\' 11"  (1.499 m)   Wt 126 lb (57.2 kg)   SpO2 98%   BMI 25.45 kg/m  General: Awake, appears stated age Neck: Supple, symmetric, no obvious nodules or thyromegaly appreciated.  There is no tenderness to palpation. Heart: RRR, no LE edema Lungs: CTAB, no rales, wheezes or rhonchi. No accessory muscle use Psych: Age appropriate judgment and insight, normal affect and mood  Assessment and Plan: Gastroesophageal reflux disease, unspecified whether esophagitis present - Plan: pantoprazole (PROTONIX) 40 MG tablet  Thyroid nodule - Plan: THYROID  Chronic, uncontrolled.  Increase Protonix from 40 mg once daily to twice daily.  Reflux precautions discussed.  Okay to use famotidine 20 mg twice daily as needed.  Generic option recommended due to cost issues. Check a thyroid ultrasound.  She is not having any thyroid symptoms. Fu in 6 mo for CPE or prn.  The patient voiced understanding and agreement to the plan.  [place], DO 11/03/22  4:32 PM

## 2022-11-10 ENCOUNTER — Other Ambulatory Visit (HOSPITAL_BASED_OUTPATIENT_CLINIC_OR_DEPARTMENT_OTHER): Payer: Self-pay

## 2022-11-24 ENCOUNTER — Ambulatory Visit (HOSPITAL_BASED_OUTPATIENT_CLINIC_OR_DEPARTMENT_OTHER)
Admission: RE | Admit: 2022-11-24 | Discharge: 2022-11-24 | Disposition: A | Discharge status: Home / Self Care | Payer: Medicare Other | Source: Ambulatory Visit | Attending: Family Medicine | Admitting: Family Medicine

## 2022-11-24 ENCOUNTER — Other Ambulatory Visit (HOSPITAL_BASED_OUTPATIENT_CLINIC_OR_DEPARTMENT_OTHER): Payer: Self-pay

## 2022-11-24 DIAGNOSIS — E041 Nontoxic single thyroid nodule: Secondary | ICD-10-CM | POA: Insufficient documentation

## 2022-12-15 ENCOUNTER — Other Ambulatory Visit (HOSPITAL_BASED_OUTPATIENT_CLINIC_OR_DEPARTMENT_OTHER): Payer: Self-pay | Admitting: Family Medicine

## 2022-12-15 DIAGNOSIS — Z1231 Encounter for screening mammogram for malignant neoplasm of breast: Secondary | ICD-10-CM

## 2022-12-22 ENCOUNTER — Ambulatory Visit (HOSPITAL_BASED_OUTPATIENT_CLINIC_OR_DEPARTMENT_OTHER)
Admission: RE | Admit: 2022-12-22 | Discharge: 2022-12-22 | Disposition: A | Discharge status: Home / Self Care | Payer: Medicare Other | Source: Ambulatory Visit | Attending: Family Medicine | Admitting: Family Medicine

## 2022-12-22 ENCOUNTER — Encounter (HOSPITAL_BASED_OUTPATIENT_CLINIC_OR_DEPARTMENT_OTHER): Payer: Self-pay

## 2022-12-22 DIAGNOSIS — Z1231 Encounter for screening mammogram for malignant neoplasm of breast: Secondary | ICD-10-CM

## 2023-01-25 ENCOUNTER — Other Ambulatory Visit (HOSPITAL_BASED_OUTPATIENT_CLINIC_OR_DEPARTMENT_OTHER): Payer: Self-pay

## 2023-02-25 ENCOUNTER — Telehealth: Payer: Self-pay | Admitting: Family Medicine

## 2023-02-25 NOTE — Telephone Encounter (Signed)
Copied from [place] [phone]. Topic: Medicare AWV >> Feb 25, 2023  9:46 AM Devoria Glassing wrote: Reason for CRM: Called patient to schedule Medicare Annual Wellness Visit (AWV). Left message for patient to call back and schedule Medicare Annual Wellness Visit (AWV).  Last date of AWV: 03/06/22  Please schedule an appointment at any time with Beatris Ship, CMA   If any questions, please contact me.  Thank you ,  Sherol Dade; [place] Direct Dial: [phone]

## 2023-03-24 ENCOUNTER — Encounter: Payer: Self-pay | Admitting: *Deleted

## 2023-05-04 ENCOUNTER — Other Ambulatory Visit (HOSPITAL_BASED_OUTPATIENT_CLINIC_OR_DEPARTMENT_OTHER): Payer: Self-pay

## 2023-05-04 ENCOUNTER — Ambulatory Visit (INDEPENDENT_AMBULATORY_CARE_PROVIDER_SITE_OTHER): Payer: Medicare Other | Admitting: Family Medicine

## 2023-05-04 ENCOUNTER — Other Ambulatory Visit: Payer: Self-pay | Admitting: Family Medicine

## 2023-05-04 ENCOUNTER — Encounter: Payer: Self-pay | Admitting: Family Medicine

## 2023-05-04 VITALS — BP 118/72 | HR 58 | Temp 98.0°F | Ht 59.0 in | Wt 123.5 lb

## 2023-05-04 DIAGNOSIS — I700 Atherosclerosis of aorta: Secondary | ICD-10-CM | POA: Diagnosis not present

## 2023-05-04 DIAGNOSIS — K219 Gastro-esophageal reflux disease without esophagitis: Secondary | ICD-10-CM

## 2023-05-04 DIAGNOSIS — E785 Hyperlipidemia, unspecified: Secondary | ICD-10-CM

## 2023-05-04 DIAGNOSIS — M778 Other enthesopathies, not elsewhere classified: Secondary | ICD-10-CM | POA: Diagnosis not present

## 2023-05-04 LAB — LIPID PANEL
Cholesterol: 249 mg/dL — ABNORMAL HIGH (ref 0–200)
HDL: 59.1 mg/dL (ref >39.00)
LDL Cholesterol: 154 mg/dL — ABNORMAL HIGH (ref 0–99)
NonHDL: 189.66
Total CHOL/HDL Ratio: 4
Triglycerides: 177 mg/dL — ABNORMAL HIGH (ref 0.0–149.0)
VLDL: 35.4 mg/dL (ref 0.0–40.0)

## 2023-05-04 LAB — COMPREHENSIVE METABOLIC PANEL
ALT: 16 U/L (ref 0–35)
AST: 19 U/L (ref 0–37)
Albumin: 4.2 g/dL (ref 3.5–5.2)
Alkaline Phosphatase: 41 U/L (ref 39–117)
BUN: 14 mg/dL (ref 6–23)
CO2: 25 mEq/L (ref 19–32)
Calcium: 9.2 mg/dL (ref 8.4–10.5)
Chloride: 106 mEq/L (ref 96–112)
Creatinine, Ser: 0.66 mg/dL (ref 0.40–1.20)
GFR: 89.38 mL/min (ref >60.00)
Glucose, Bld: 93 mg/dL (ref 70–99)
Potassium: 4.1 mEq/L (ref 3.5–5.1)
Sodium: 141 mEq/L (ref 135–145)
Total Bilirubin: 0.4 mg/dL (ref 0.2–1.2)
Total Protein: 6.4 g/dL (ref 6.0–8.3)

## 2023-05-04 MED ORDER — SIMVASTATIN 40 MG PO TABS
40.0000 mg | ORAL_TABLET | Freq: Every day | ORAL | 2 refills | Status: DC
  Filled 2023-05-04: qty 90, 90d supply, fill #0

## 2023-05-04 NOTE — Patient Instructions (Addendum)
Give [place] 2-3 business days to get the results of your labs back.   Keep the diet clean and stay active.  Ice/cold pack over area for 10-15 min twice daily.  Heat (pad or rice pillow in microwave) over affected area, 10-15 minutes twice daily.   OK to take Tylenol 1000 mg (2 extra strength tabs) or 975 mg (3 regular strength tabs) every 6 hours as needed.  Consider getting a thumb spica splint to help put your wrist in a better position for healing.  Send me a message or call in 1 month if no better.   Let us know if you need anything.  Wrist and Forearm Exercises Do exercises exactly as told by your health care provider and adjust them as directed. It is normal to feel mild stretching, pulling, tightness, or discomfort as you do these exercises, but you should stop right away if you feel sudden pain or your pain gets worse.   RANGE OF MOTION EXERCISES These exercises warm up your muscles and joints and improve the movement and flexibility of your injured wrist and forearm. These exercises also help to relieve pain, numbness, and tingling. These exercises are done using the muscles in your injured wrist and forearm. Exercise A: Wrist Flexion, Active With your fingers relaxed, bend your wrist forward as far as you can. Hold this position for 30 seconds. Repeat 2 times. Complete this exercise 3 times per week. Exercise B: Wrist Extension, Active With your fingers relaxed, bend your wrist backward as far as you can. Hold this position for 30 seconds. Repeat 2 times. Complete this exercise 3 times per week. Exercise C: Supination, Active  Stand or sit with your arms at your sides. Bend your left / right elbow to an "L" shape (90 degrees). Turn your palm upward until you feel a gentle stretch on the inside of your forearm. Hold this position for 30 seconds. Slowly return your palm to the starting position. Repeat 2 times. Complete this exercise 3 times per week. Exercise D: Pronation,  Active  Stand or sit with your arms at your sides. Bend your left / right elbow to an "L" shape (90 degrees). Turn your palm downward until you feel a gentle stretch on the top of your forearm. Hold this position for 30 seconds. Slowly return your palm to the starting position. Repeat 2 times. Complete this exercise once a day.  STRETCHING EXERCISES These exercises warm up your muscles and joints and improve the movement and flexibility of your injured wrist and forearm. These exercises also help to relieve pain, numbness, and tingling. These exercises are done using your healthy wrist and forearm to help stretch the muscles in your injured wrist and forearm. Exercise E: Wrist Flexion, Passive  Extend your left / right arm in front of you, relax your wrist, and point your fingers downward. Gently push on the back of your hand. Stop when you feel a gentle stretch on the top of your forearm. Hold this position for 30 seconds. Repeat 2 times. Complete this exercise 3 times per week. Exercise F: Wrist Extension, Passive  Extend your left / right arm in front of you and turn your palm upward. Gently pull your palm and fingertips back so your fingers point downward. You should feel a gentle stretch on the palm-side of your forearm. Hold this position for 30 seconds. Repeat 2 times. Complete this exercise 3 times per week. Exercise G: Forearm Rotation, Supination, Passive Sit with your left / right  elbow bent to an "L" shape (90 degrees) with your forearm resting on a table. Keeping your upper body and shoulder still, use your other hand to rotate your forearm palm-up until you feel a gentle to moderate stretch. Hold this position for 30 seconds. Slowly release the stretch and return to the starting position. Repeat 2 times. Complete this exercise 3 times per week. Exercise H: Forearm Rotation, Pronation, Passive Sit with your left / right elbow bent to an "L" shape (90 degrees) with your  forearm resting on a table. Keeping your upper body and shoulder still, use your other hand to rotate your forearm palm-down until you feel a gentle to moderate stretch. Hold this position for 30 seconds. Slowly release the stretch and return to the starting position. Repeat 2 times. Complete this exercise 3 times per week.  STRENGTHENING EXERCISES These exercises build strength and endurance in your wrist and forearm. Endurance is the ability to use your muscles for a long time, even after they get tired. Exercise I: Wrist Flexors  Sit with your left / right forearm supported on a table and your hand resting palm-up over the edge of the table. Your elbow should be bent to an "L" shape (about 90 degrees) and be below the level of your shoulder. Hold a 3-5 lb weight in your left / right hand. Or, hold a rubber exercise band or tube in both hands, keeping your hands at the same level and hip distance apart. There should be a slight tension in the exercise band or tube. Slowly curl your hand up toward your forearm. Hold this position for 3 seconds. Slowly lower your hand back to the starting position. Repeat 2 times. Complete this exercise 3 times per week. Exercise J: Wrist Extensors  Sit with your left / right forearm supported on a table and your hand resting palm-down over the edge of the table. Your elbow should be bent to an "L" shape (about 90 degrees) and be below the level of your shoulder. Hold a 3-5 lb weight in your left / right hand. Or, hold a rubber exercise band or tube in both hands, keeping your hands at the same level and hip distance apart. There should be a slight tension in the exercise band or tube. Slowly curl your hand up toward your forearm. Hold this position for 3 seconds. Slowly lower your hand back to the starting position. Repeat 2 times. Complete this exercise 3 times per week. Exercise K: Forearm Rotation, Supination  Sit with your left / right forearm  supported on a table and your hand resting palm-down. Your elbow should be at your side, bent to an "L" shape (about 90 degrees), and below the level of your shoulder. Keep your wrist stable and in a neutral position throughout the exercise. Gently hold a lightweight hammer with your left / right hand. Without moving your elbow or wrist, slowly rotate your palm upward to a thumbs-up position. Hold this position for 3 seconds. Slowly return your forearm to the starting position. Repeat 2 times. Complete this exercise 3 times per week. Exercise L: Forearm Rotation, Pronation  Sit with your left / right forearm supported on a table and your hand resting palm-up. Your elbow should be at your side, bent to an "L" shape (about 90 degrees), and below the level of your shoulder. Keep your wrist stable. Do not allow it to move backward or forward during the exercise. Gently hold a lightweight hammer with your left /  right hand. Without moving your elbow or wrist, slowly rotate your palm and hand upward to a thumbs-up position. Hold this position for 3 seconds. Slowly return your forearm to the starting position. Repeat 2 times. Complete this exercise 3 times per week. Exercise M: Grip Strengthening  Hold one of these items in your left / right hand: play dough, therapy putty, a dense sponge, a stress ball, or a large, rolled sock. Squeeze as hard as you can without increasing pain. Hold this position for 5 seconds. Slowly release your grip. Repeat 2 times. Complete this exercise 3 times per week.  This information is not intended to replace advice given to you by your health care provider. Make sure you discuss any questions you have with your health care provider. Document Released: 10/07/2005 Document Revised: 08/17/2016 Document Reviewed: 08/18/2015 Elsevier Interactive Patient Education  [employer].

## 2023-05-04 NOTE — Progress Notes (Signed)
Chief Complaint  Patient presents with   Follow-up    Right wrist pain    Subjective: Aortic atherosclerosis Patient presents for aortic atherosclerosis follow up. Currently taking Zocor 40 mg/d and compliance with treatment thus far has been good. She denies myalgias. She is adhering to a healthy diet. Exercise:  The patient is not known to have coexisting coronary artery disease. No CP or SOB.   GERD Taking Protonix 40 mg/d. No wt loss, bleeding, difficulty swallowing. No AE's from meds. Reports compliance.   R wrist pain Onset:  1 month ago. No inj or change in activity.  Location: R wrist Character:  aching  Progression of issue:  is unchanged Associated symptoms: hurts when she uses it No bruising, swelling, redness, loss of ROM Treatment: to date has been topical NSAIDs.   Neurovascular symptoms: no  Past Medical History:  Diagnosis Date   Acid reflux     Objective: BP 118/72 (BP Location: Left Arm, Patient Position: Sitting, Cuff Size: Normal)   Pulse (!) 58   Temp 98 F (36.7 C) (Oral)   Ht 4\' 11"  (1.499 m)   Wt 123 lb 8 oz (56 kg)   SpO2 97%   BMI 24.94 kg/m  General: Awake, appears stated age HEENT: MMM Heart: Regular rhythm, bradycardic, no LE edema, no bruits Lungs: CTAB, no rales, wheezes or rhonchi. No accessory muscle use MSK: Normal active/passive range of motion of the right wrist.  No obvious deformity or swelling.  No ecchymosis or excessive warmth.  TTP over the lateral flexor tendon of the wrist.  No bony tenderness appreciated. Psych: Age appropriate judgment and insight, normal affect and mood  Assessment and Plan: Atherosclerosis of aorta (HCC) - Plan: simvastatin (ZOCOR) 40 MG tablet, Lipid panel, Comprehensive metabolic panel  Gastroesophageal reflux disease, unspecified whether esophagitis present  Right wrist tendinitis  Chronic, stable.  Continue simvastatin 40 mg daily.  Counseled diet and exercise.  Check labs today. Chronic,  stable.  Continue Protonix 40 mg daily as needed.  Discussed reflux precautions.  No red flag signs/symptoms. Heat, ice, Tylenol, stretching exercises, thumb spica splint.  Send message if no better in the next month.  Will refer to sports medicine. F/u in 6 mo for CPE. The patient voiced understanding and agreement to the plan.  Jilda Roche [place], DO 05/04/23  10:43 AM

## 2023-06-15 ENCOUNTER — Other Ambulatory Visit: Payer: Medicare Other

## 2023-06-17 ENCOUNTER — Other Ambulatory Visit (INDEPENDENT_AMBULATORY_CARE_PROVIDER_SITE_OTHER): Payer: Medicare Other

## 2023-06-17 DIAGNOSIS — E785 Hyperlipidemia, unspecified: Secondary | ICD-10-CM | POA: Diagnosis not present

## 2023-06-17 LAB — LIPID PANEL
Cholesterol: 184 mg/dL (ref 0–200)
HDL: 59 mg/dL (ref >39.00)
LDL Cholesterol: 99 mg/dL (ref 0–99)
NonHDL: 124.77
Total CHOL/HDL Ratio: 3
Triglycerides: 129 mg/dL (ref 0.0–149.0)
VLDL: 25.8 mg/dL (ref 0.0–40.0)

## 2023-06-28 ENCOUNTER — Telehealth: Payer: Self-pay | Admitting: Family Medicine

## 2023-06-28 NOTE — Telephone Encounter (Signed)
Copied from CRM [id number]. Topic: Medicare AWV >> Jun 28, 2023  1:12 PM Payton Doughty wrote: Reason for CRM: LM 06/28/2023 to schedule AWV   Verlee Rossetti; Care Guide Ambulatory Clinical Support [place] [facility] Direct Dial: [phone]

## 2023-07-20 ENCOUNTER — Ambulatory Visit (INDEPENDENT_AMBULATORY_CARE_PROVIDER_SITE_OTHER): Payer: Medicare Other | Admitting: Family Medicine

## 2023-07-20 ENCOUNTER — Other Ambulatory Visit (HOSPITAL_BASED_OUTPATIENT_CLINIC_OR_DEPARTMENT_OTHER): Payer: Self-pay

## 2023-07-20 ENCOUNTER — Encounter: Payer: Self-pay | Admitting: Family Medicine

## 2023-07-20 VITALS — BP 131/79 | HR 45 | Temp 98.7°F | Ht 59.0 in | Wt 128.2 lb

## 2023-07-20 DIAGNOSIS — R600 Localized edema: Secondary | ICD-10-CM

## 2023-07-20 DIAGNOSIS — M778 Other enthesopathies, not elsewhere classified: Secondary | ICD-10-CM

## 2023-07-20 DIAGNOSIS — I700 Atherosclerosis of aorta: Secondary | ICD-10-CM | POA: Diagnosis not present

## 2023-07-20 MED ORDER — SIMVASTATIN 40 MG PO TABS
40.0000 mg | ORAL_TABLET | Freq: Every day | ORAL | 3 refills | Status: DC
  Filled 2023-07-20: qty 90, 90d supply, fill #0
  Filled 2023-11-08: qty 30, 30d supply, fill #1
  Filled 2023-11-08: qty 90, 90d supply, fill #1
  Filled 2023-11-08: qty 30, 30d supply, fill #1
  Filled 2023-11-08: qty 90, 90d supply, fill #1
  Filled 2023-12-06: qty 90, 90d supply, fill #2
  Filled 2024-03-08: qty 90, 90d supply, fill #3

## 2023-07-20 MED ORDER — MELOXICAM 15 MG PO TABS
15.0000 mg | ORAL_TABLET | Freq: Every day | ORAL | 0 refills | Status: DC
  Filled 2023-07-20: qty 30, 30d supply, fill #0

## 2023-07-20 NOTE — Progress Notes (Signed)
Chief Complaint  Patient presents with   swelling on right side of neck    Haley Walker is a 72 y.o. female here for a skin complaint.  Duration: 2 weeks Location: under R jaw Pruritic? No Painful? No Drainage? No Illness/fever? No Sick contacts? No Other associated symptoms: no trauma Therapies tried thus far: ibuprofen which helped, amox which did not help  Patient continues to have wrist pain on the right.  She tried the stretches and exercises and bracing without relief.  No recent trauma or change in activity.  No bruising, redness, or swelling.  Past Medical History:  Diagnosis Date   Acid reflux    Aortic atherosclerosis (HCC)     BP 131/79 (BP Location: Left Arm, Patient Position: Sitting, Cuff Size: Normal)   Pulse (!) 45   Temp 98.7 F (37.1 C) (Oral)   Ht 4\' 11"  (1.499 m)   Wt 128 lb 4 oz (58.2 kg)   SpO2 98%   BMI 25.90 kg/m  Gen: awake, alert, appearing stated age Lungs: CTAB. No accessory muscle use Heart: Bradycardic, regular rhythm, no lower extremity edema Lymph: Slightly enlarged right submandibular gland compared to the left without fluctuance or excessive warmth Skin: No external lesions appreciated. No drainage, erythema, TTP, fluctuance, excoriation Psych: Age appropriate judgment and insight  Submandibular gland swelling - Plan: meloxicam (MOBIC) 15 MG tablet  Right wrist tendinitis - Plan: Ambulatory referral to Sports Medicine  Atherosclerosis of aorta (HCC) - Plan: simvastatin (ZOCOR) 40 MG tablet  Continue anti-inflammatory medication to help improve things.  She will send [place] a message if anything changes. Refer to sports medicine for further evaluation. Refill Zocor for above diagnosis. F/u prn. The patient voiced understanding and agreement to the plan.  Jilda Roche [place], DO 07/20/23 12:03 PM

## 2023-07-20 NOTE — Patient Instructions (Addendum)
OK to take Tylenol 1000 mg (2 extra strength tabs) or 975 mg (3 regular strength tabs) every 6 hours as needed.  Ice/cold pack over area for 10-15 min twice daily.  If you do not hear anything about your referral in the next 1-2 weeks, call our office and ask for an update.  Let us know if you need anything.

## 2023-07-28 ENCOUNTER — Ambulatory Visit: Payer: Medicare Other | Admitting: Sports Medicine

## 2023-08-11 ENCOUNTER — Ambulatory Visit (INDEPENDENT_AMBULATORY_CARE_PROVIDER_SITE_OTHER): Payer: Medicare Other | Admitting: Family Medicine

## 2023-08-11 ENCOUNTER — Ambulatory Visit: Payer: Medicare Other | Admitting: Sports Medicine

## 2023-08-11 ENCOUNTER — Encounter: Payer: Self-pay | Admitting: Sports Medicine

## 2023-08-11 ENCOUNTER — Ambulatory Visit (HOSPITAL_BASED_OUTPATIENT_CLINIC_OR_DEPARTMENT_OTHER)
Admission: RE | Admit: 2023-08-11 | Discharge: 2023-08-11 | Disposition: A | Discharge status: Home / Self Care | Payer: Medicare Other | Source: Ambulatory Visit | Attending: Sports Medicine | Admitting: Sports Medicine

## 2023-08-11 ENCOUNTER — Encounter: Payer: Self-pay | Admitting: Family Medicine

## 2023-08-11 VITALS — BP 121/76 | HR 56 | Temp 98.2°F | Ht 59.0 in | Wt 128.5 lb

## 2023-08-11 VITALS — BP 134/70 | Ht 59.0 in | Wt 128.0 lb

## 2023-08-11 DIAGNOSIS — M7989 Other specified soft tissue disorders: Secondary | ICD-10-CM | POA: Diagnosis not present

## 2023-08-11 DIAGNOSIS — M25531 Pain in right wrist: Secondary | ICD-10-CM | POA: Insufficient documentation

## 2023-08-11 DIAGNOSIS — K121 Other forms of stomatitis: Secondary | ICD-10-CM

## 2023-08-11 NOTE — Patient Instructions (Addendum)
Consider Biotene.   Stay hydrated.   If one of the sores in your mouth lasts longer than 2 weeks and is not improving, please let me know.   Someone will reach out regarding scheduling your CT scan.   Let us know if you need anything.

## 2023-08-11 NOTE — Progress Notes (Signed)
   Subjective:    Patient ID: Haley Walker, female    DOB: 08-05-1951, 72 y.o.   MRN: [id number]  HPI chief complaint: Right wrist pain  Patient is a 72 year old right-hand-dominant female that presents today with 2 months of volar sided wrist pain.  Pain began without any known trauma.  It is worse with activity.  It is intermittent.  She has been applying a topical pain reliever which does seem to be beneficial.  She denies pain on the dorsum of the wrist.  No treatment or imaging to date.  Past medical history reviewed Medications reviewed Allergies reviewed    Review of Systems As above    Objective:   Physical Exam  Well-developed, well-nourished.  No acute distress.  Right wrist: Full range of motion.  No effusion.  No soft tissue swelling.  She is tender to palpation directly over the flexor carpi radialis tendon on the volar aspect of the wrist.  Good strength.  Negative Finkelstein's.  Negative Tinel's at the carpal tunnel.  Good grip strength.  Good pulses.  X-rays of the right wrist including AP, lateral, and scaphoid views show nothing acute and no significant degenerative changes  Limited MSK ultrasound of the volar right wrist today does show a slight amount of fluid around the flexor carpi radialis tendon      Assessment & Plan:   Right wrist pain secondary to flexor carpi radialis tendinitis  Cock up wrist brace to be worn as needed with activity.  She may continue with her topical pain reliever as needed also.  If symptoms persist, consider merits of formal physical therapy.  Follow-up for ongoing or recalcitrant issues.  This note was dictated using Dragon naturally speaking software and may contain errors in syntax, spelling, or content which have not been identified prior to signing this note.

## 2023-08-11 NOTE — Progress Notes (Signed)
Chief Complaint  Patient presents with   Follow-up    Haley Walker is a 72 y.o. female here for a skin complaint. Here w friend who helps interpret ([place]).   Duration: 5 weeks Location: R jaw Pruritic? No Painful? No Drainage? No New soaps/lotions/topicals/detergents? No Sick contacts? No Other associated symptoms: + Intermittent-lasting sores in mouth which take around 1 week to resolve but show up in a different spot; no trauma, fevers, recent illness, stress Therapies tried thus far: Mobic  Past Medical History:  Diagnosis Date   Acid reflux    Aortic atherosclerosis (HCC)     BP 121/76 (BP Location: Left Arm, Patient Position: Sitting, Cuff Size: Normal)   Pulse (!) 56   Temp 98.2 F (36.8 C) (Oral)   Ht 4\' 11"  (1.499 m)   Wt 128 lb 8 oz (58.3 kg)   SpO2 93%   BMI 25.95 kg/m  Gen: awake, alert, appearing stated age Mouth: MMM, aphthous ulcer noted over the right anterior buccal mucosa Lungs: No accessory muscle use Skin: 2 cm circular lesion in submand region on R without drainage, erythema, TTP, fluctuance, excoriation. Freely moveable.  Psych: Age appropriate judgment and insight  Soft tissue mass - Plan: CT Soft Tissue Neck W Contrast  Oral ulcer  Will ensure nothing sinister is happening. We will be in touch regarding results.  Consider Biotene mouthwash.  With how long these last, seems like aphthous ulcer is the most likely diagnosis.  If it does last longer than 14 days, would consider referral to ENT for possible biopsy. Tylenol prn.  F/u prn. The patient and her friend voiced understanding and agreement to the plan.  Jilda Roche [place], DO 08/11/23 4:57 PM

## 2023-08-24 ENCOUNTER — Encounter (HOSPITAL_BASED_OUTPATIENT_CLINIC_OR_DEPARTMENT_OTHER): Payer: Self-pay

## 2023-08-24 ENCOUNTER — Other Ambulatory Visit (HOSPITAL_BASED_OUTPATIENT_CLINIC_OR_DEPARTMENT_OTHER): Payer: Self-pay

## 2023-08-24 ENCOUNTER — Ambulatory Visit (HOSPITAL_BASED_OUTPATIENT_CLINIC_OR_DEPARTMENT_OTHER)
Admission: RE | Admit: 2023-08-24 | Discharge: 2023-08-24 | Disposition: A | Discharge status: Home / Self Care | Payer: Medicare Other | Source: Ambulatory Visit | Attending: Family Medicine | Admitting: Family Medicine

## 2023-08-24 DIAGNOSIS — M7989 Other specified soft tissue disorders: Secondary | ICD-10-CM | POA: Insufficient documentation

## 2023-08-24 MED ORDER — IOHEXOL 300 MG/ML  SOLN
100.0000 mL | Freq: Once | INTRAMUSCULAR | Status: AC | PRN
  Administered 2023-08-24: 75 mL via INTRAVENOUS

## 2023-08-24 MED ORDER — FLUAD 0.5 ML IM SUSY
0.5000 mL | PREFILLED_SYRINGE | Freq: Once | INTRAMUSCULAR | 0 refills | Status: AC
  Filled 2023-08-24: qty 0.5, 1d supply, fill #0

## 2023-11-08 ENCOUNTER — Other Ambulatory Visit (HOSPITAL_BASED_OUTPATIENT_CLINIC_OR_DEPARTMENT_OTHER): Payer: Self-pay

## 2023-11-08 ENCOUNTER — Other Ambulatory Visit: Payer: Self-pay

## 2023-11-08 ENCOUNTER — Other Ambulatory Visit: Payer: Self-pay | Admitting: Family Medicine

## 2023-11-08 DIAGNOSIS — K219 Gastro-esophageal reflux disease without esophagitis: Secondary | ICD-10-CM

## 2023-11-08 MED ORDER — PANTOPRAZOLE SODIUM 40 MG PO TBEC
40.0000 mg | DELAYED_RELEASE_TABLET | Freq: Two times a day (BID) | ORAL | 2 refills | Status: DC
  Filled 2023-11-08: qty 162, 81d supply, fill #0
  Filled 2023-11-08: qty 18, 9d supply, fill #0
  Filled 2023-11-19 – 2023-12-06 (×3): qty 180, 90d supply, fill #0

## 2023-11-18 ENCOUNTER — Other Ambulatory Visit (HOSPITAL_BASED_OUTPATIENT_CLINIC_OR_DEPARTMENT_OTHER): Payer: Self-pay

## 2023-11-19 ENCOUNTER — Other Ambulatory Visit (HOSPITAL_BASED_OUTPATIENT_CLINIC_OR_DEPARTMENT_OTHER): Payer: Self-pay

## 2023-11-29 ENCOUNTER — Other Ambulatory Visit (HOSPITAL_BASED_OUTPATIENT_CLINIC_OR_DEPARTMENT_OTHER): Payer: Self-pay

## 2023-12-06 ENCOUNTER — Other Ambulatory Visit (HOSPITAL_BASED_OUTPATIENT_CLINIC_OR_DEPARTMENT_OTHER): Payer: Self-pay

## 2023-12-17 ENCOUNTER — Other Ambulatory Visit (HOSPITAL_BASED_OUTPATIENT_CLINIC_OR_DEPARTMENT_OTHER): Payer: Self-pay | Admitting: Family Medicine

## 2023-12-17 DIAGNOSIS — Z139 Encounter for screening, unspecified: Secondary | ICD-10-CM

## 2023-12-29 ENCOUNTER — Ambulatory Visit (INDEPENDENT_AMBULATORY_CARE_PROVIDER_SITE_OTHER): Payer: Medicare Other | Admitting: Family Medicine

## 2023-12-29 ENCOUNTER — Ambulatory Visit: Payer: Medicare Other

## 2023-12-29 ENCOUNTER — Encounter: Payer: Self-pay | Admitting: Family Medicine

## 2023-12-29 ENCOUNTER — Other Ambulatory Visit (HOSPITAL_BASED_OUTPATIENT_CLINIC_OR_DEPARTMENT_OTHER): Payer: Self-pay

## 2023-12-29 VITALS — BP 132/68 | HR 52 | Temp 97.8°F | Resp 16 | Ht 59.0 in | Wt 136.0 lb

## 2023-12-29 DIAGNOSIS — M25471 Effusion, right ankle: Secondary | ICD-10-CM

## 2023-12-29 DIAGNOSIS — M8580 Other specified disorders of bone density and structure, unspecified site: Secondary | ICD-10-CM | POA: Diagnosis not present

## 2023-12-29 DIAGNOSIS — M25571 Pain in right ankle and joints of right foot: Secondary | ICD-10-CM | POA: Diagnosis not present

## 2023-12-29 DIAGNOSIS — Z0000 Encounter for general adult medical examination without abnormal findings: Secondary | ICD-10-CM

## 2023-12-29 DIAGNOSIS — S99911A Unspecified injury of right ankle, initial encounter: Secondary | ICD-10-CM | POA: Diagnosis not present

## 2023-12-29 DIAGNOSIS — I700 Atherosclerosis of aorta: Secondary | ICD-10-CM | POA: Diagnosis not present

## 2023-12-29 LAB — COMPREHENSIVE METABOLIC PANEL
ALT: 18 U/L (ref 0–35)
AST: 19 U/L (ref 0–37)
Albumin: 4.4 g/dL (ref 3.5–5.2)
Alkaline Phosphatase: 40 U/L (ref 39–117)
BUN: 15 mg/dL (ref 6–23)
CO2: 28 meq/L (ref 19–32)
Calcium: 9.2 mg/dL (ref 8.4–10.5)
Chloride: 106 meq/L (ref 96–112)
Creatinine, Ser: 0.67 mg/dL (ref 0.40–1.20)
GFR: 88.65 mL/min (ref >60.00)
Glucose, Bld: 89 mg/dL (ref 70–99)
Potassium: 4 meq/L (ref 3.5–5.1)
Sodium: 141 meq/L (ref 135–145)
Total Bilirubin: 0.6 mg/dL (ref 0.2–1.2)
Total Protein: 6.5 g/dL (ref 6.0–8.3)

## 2023-12-29 LAB — LIPID PANEL
Cholesterol: 178 mg/dL (ref 0–200)
HDL: 54.8 mg/dL (ref >39.00)
LDL Cholesterol: 86 mg/dL (ref 0–99)
NonHDL: 122.79
Total CHOL/HDL Ratio: 3
Triglycerides: 184 mg/dL — ABNORMAL HIGH (ref 0.0–149.0)
VLDL: 36.8 mg/dL (ref 0.0–40.0)

## 2023-12-29 LAB — CBC
HCT: 41.7 % (ref 36.0–46.0)
Hemoglobin: 13.9 g/dL (ref 12.0–15.0)
MCHC: 33.4 g/dL (ref 30.0–36.0)
MCV: 95.1 fL (ref 78.0–100.0)
Platelets: 295 10*3/uL (ref 150.0–400.0)
RBC: 4.38 Mil/uL (ref 3.87–5.11)
RDW: 12.8 % (ref 11.5–15.5)
WBC: 5.4 10*3/uL (ref 4.0–10.5)

## 2023-12-29 MED ORDER — DICLOFENAC SODIUM 1 % EX GEL
CUTANEOUS | 0 refills | Status: DC
  Filled 2023-12-29: qty 100, 10d supply, fill #0

## 2023-12-29 NOTE — Progress Notes (Signed)
Chief Complaint  Patient presents with   Annual Exam    Annual Exam     Well Woman Haley Walker is here for a complete physical.   Her last physical was >1 year ago.  Current diet: in general, a "healthy" diet. Current exercise: pickle ball, walking. Weight is stable and she denies daytime fatigue. Seatbelt? Yes Advanced directive? No  Health Maintenance Colonoscopy- Yes Shingrix- No DEXA- Due Mammogram- Yes Tetanus- Yes Pneumonia- Yes Hep C screen- Yes  Vitamin D Yesterday the patient was playing pickle ball and fell inverting her right ankle.  She had immediate pain over the bone.  She has associated swelling.  No bruising, redness.  Walking is painful.  She has been using Voltaren gel with some relief.  No neurologic signs or symptoms.  Past Medical History:  Diagnosis Date   Acid reflux    Aortic atherosclerosis (HCC)      Past Surgical History:  Procedure Laterality Date   CESAREAN SECTION     3 times    Medications  Current Outpatient Medications on File Prior to Visit  Medication Sig Dispense Refill   cetirizine (ZYRTEC) 10 MG tablet Take 1 tablet (10 mg total) by mouth daily. 100 tablet 0   Glucosamine-Chondroitin (GLUCOSAMINE CHONDR COMPLEX PO) Take by mouth.     meloxicam (MOBIC) 15 MG tablet Take 1 tablet (15 mg total) by mouth daily. 30 tablet 0   Multiple Vitamin (MULTIVITAMIN ADULT PO) Take by mouth.     pantoprazole (PROTONIX) 40 MG tablet Take 1 tablet (40 mg total) by mouth 2 (two) times daily before a meal. 180 tablet 2   simvastatin (ZOCOR) 40 MG tablet Take 1 tablet (40 mg total) by mouth daily at 6 PM. 90 tablet 3    Allergies No Known Allergies  Review of Systems: Constitutional:  no fevers Eye:  no recent significant change in vision Ears:  No changes in hearing Nose/Mouth/Throat:  no complaints of nasal congestion, no sore throat Cardiovascular: no chest pain Respiratory:  No shortness of breath Gastrointestinal:  No change in bowel  habits GU:  Female: negative for dysuria Integumentary:  no abnormal skin lesions reported Neurologic:  no headaches Endocrine:  denies unexplained weight changes  Exam BP 132/68   Pulse (!) 52   Temp 97.8 F (36.6 C) (Oral)   Resp 16   Ht 4\' 11"  (1.499 m)   Wt 136 lb (61.7 kg)   SpO2 98%   BMI 27.47 kg/m  General:  well developed, well nourished, in no apparent distress Skin:  no significant moles, warts, or growths Head:  no masses, lesions, or tenderness Eyes:  pupils equal and round, sclera anicteric without injection Ears:  canals without lesions, TMs shiny without retraction, no obvious effusion, no erythema MSK: Lateral edema over the right ankle with TTP over the distal lateral fibula.  There is no TTP over the proximal fibular head, peroneus longus/brevis, medial malleolus, cuboid, or base of the fifth metatarsal; negative squeeze and anterior drawer of the ankle.  No obvious deformity. Nose:  nares patent, mucosa normal, and no drainage Throat/Pharynx:  lips and gingiva without lesion; tongue and uvula midline; non-inflamed pharynx; no exudates or postnasal drainage Neck: neck supple without adenopathy, thyromegaly, or masses Lungs:  clear to auscultation, breath sounds equal bilaterally, no respiratory distress Cardio:  regular rate and rhythm, no bruits or LE edema Abdomen:  abdomen soft, nontender; bowel sounds normal; no masses or organomegaly Genital: Deferred Neuro:  gait antalgic; deep  tendon reflexes normal and symmetric Psych: well oriented with normal range of affect and appropriate judgment/insight  Assessment and Plan  Well adult exam  Injury of right ankle, initial encounter - Plan: DG Ankle Complete Right, diclofenac Sodium (VOLTAREN ARTHRITIS PAIN) 1 % GEL  Atherosclerosis of aorta (HCC) - Plan: Comprehensive metabolic panel, CBC, Lipid panel  Osteopenia, unspecified location - Plan: DG Bone Density   Well 73 y.o. female. Counseled on diet and  exercise. Advanced directive form provided today.  Strength training recommended. Due for bone density scan. Right ankle pain: Check x-ray to rule out fibular fracture.  Ice, Tylenol, stretches and exercises.  Do not do the stretches prior to receiving the results of the x-ray.  We will send her to the med center in [place]. Other orders as above. Follow up in 6 months or as needed. The patient voiced understanding and agreement to the plan.  Jilda Roche [place], DO 12/29/23 9:19 AM

## 2023-12-29 NOTE — Patient Instructions (Addendum)
Give [place] 2-3 business days to get the results of your labs back.   Keep the diet clean and stay active.  Please get your X-ray done at the [facility] in [place]: [address], [place], [place] [address] [phone]  You do not need an appointment for this location.   Ice/cold pack over area for 10-15 min twice daily.  OK to take Tylenol 1000 mg (2 extra strength tabs) or 975 mg (3 regular strength tabs) every 6 hours as needed.  Don't do the stretches until we know the results of your X-ray.   The Shingrix vaccine (for shingles) is a 2 shot series spaced 2-6 months apart. It can make people feel low energy, achy and almost like they have the flu for 48 hours after injection. 1/5 people can have nausea and/or vomiting. Please plan accordingly when deciding on when to get this shot. Call your pharmacy to get this. The second shot of the series is less severe regarding the side effects, but it still lasts 48 hours.   Let us know if you need anything.  Ankle Exercises It is normal to feel mild stretching, pulling, tightness, or discomfort as you do these exercises, but you should stop right away if you feel sudden pain or your pain gets worse.  Stretching and range of motion exercises These exercises warm up your muscles and joints and improve the movement and flexibility of your ankle. These exercises also help to relieve pain, numbness, and tingling. Exercise A: Dorsiflexion/Plantar Flexion    Sit with your affected knee straight or bent. Do not rest your foot on anything. Flex your affected ankle to tilt the top of your foot toward your shin. Hold this position for 5 seconds. Point your toes downward to tilt the top of your foot away from your shin. Hold this position for 5 seconds. Repeat 2 times. Complete this exercise 3 times per week. Exercise B: Ankle Alphabet    Sit with your affected foot supported at your lower leg. Do not rest your foot on anything. Make sure  your foot has room to move freely. Think of your affected foot as a paintbrush, and move your foot to trace each letter of the alphabet in the air. Keep your hip and knee still while you trace. Make the letters as large as you can without increasing any discomfort. Trace every letter from A to Z. Repeat 2 times. Complete this exercise 3 times per week. Strengthening exercises These exercises build strength and endurance in your ankle. Endurance is the ability to use your muscles for a long time, even after they get tired. Exercise D: Dorsiflexors    Secure a rubber exercise band or tube to an object, such as a table leg, that will stay still when the band is pulled. Secure the other end around your affected foot. Sit on the floor, facing the object with your affected leg extended. The band or tube should be slightly tense when your foot is relaxed. Slowly flex your affected ankle and toes to bring your foot toward you. Hold this position for 3 seconds.  Slowly return your foot to the starting position, controlling the band as you do that. Do a total of 10 repetitions. Repeat 2 times. Complete this exercise 3 times per week. Exercise E: Plantar Flexors    Sit on the floor with your affected leg extended. Loop a rubber exercise band or tube around the ball of your affected foot. The ball of your  foot is on the walking surface, right under your toes. The band or tube should be slightly tense when your foot is relaxed. Slowly point your toes downward, pushing them away from you. Hold this position for 3 seconds. Slowly release the tension in the band or tube, controlling smoothly until your foot is back in the starting position. Repeat for a total of 10 repetitions. Repeat 2 times. Complete this exercise 3 times per week. Exercise F: Towel Curls    Sit in a chair on a non-carpeted surface, and put your feet on the floor. Place a towel in front of your feet.  Keeping your heel on the floor,  put your affected foot on the towel. Pull the towel toward you by grabbing the towel with your toes and curling them under. Keep your heel on the floor. Let your toes relax. Grab the towel again. Keep going until the towel is completely underneath your foot. Repeat for a total of 10 repetitions. Repeat 2 times. Complete this exercise 3 times per week. Exercise G: Heel Raise ( Plantar Flexors, Standing)     Stand with your feet shoulder-width apart. Keep your weight spread evenly over the width of your feet while you rise up on your toes. Use a wall or table to steady yourself, but try not to use it for support. If this exercise is too easy, try these options: Shift your weight toward your affected leg until you feel challenged. If told by your health care provider, lift your uninjured leg off the floor. Hold this position for 3 seconds. Repeat for a total of 10 repetitions. Repeat 2 times. Complete this exercise 3 times per week. Exercise H: Tandem Walking Stand with one foot directly in front of the other. Slowly raise your back foot up, lifting your heel before your toes, and place it directly in front of your other foot. Continue to walk in this heel-to-toe way for 10 steps or for as long as told by your health care provider. Have a countertop or wall nearby to use if needed to keep your balance, but try not to hold onto anything for support. Repeat 2 times. Complete this exercises 3 times per week. Make sure you discuss any questions you have with your health care provider. Document Released: 10/07/2005 Document Revised: 07/23/2016 Document Reviewed: 08/11/2015 Elsevier Interactive Patient Education  2018 [employer].

## 2023-12-31 ENCOUNTER — Telehealth: Payer: Self-pay

## 2023-12-31 ENCOUNTER — Other Ambulatory Visit (HOSPITAL_BASED_OUTPATIENT_CLINIC_OR_DEPARTMENT_OTHER): Payer: Self-pay

## 2023-12-31 MED ORDER — FENOFIBRATE 48 MG PO TABS
48.0000 mg | ORAL_TABLET | Freq: Every day | ORAL | 3 refills | Status: DC
  Filled 2023-12-31: qty 30, 30d supply, fill #0

## 2023-12-31 NOTE — Telephone Encounter (Signed)
Called spoke with Myriam Jacobson and she stated would let pt know.

## 2023-12-31 NOTE — Addendum Note (Signed)
Addended by: Radene Gunning on: 12/31/2023 04:40 PM   Modules accepted: Orders

## 2023-12-31 NOTE — Telephone Encounter (Signed)
Message sent to provider

## 2023-12-31 NOTE — Telephone Encounter (Signed)
Message sent

## 2023-12-31 NOTE — Telephone Encounter (Signed)
Let's add a low dose of another med. I will send in. Please schedule a lab visit in 6 weeks and order a lipid and hep function panel. Thank you.

## 2024-01-03 ENCOUNTER — Encounter (HOSPITAL_BASED_OUTPATIENT_CLINIC_OR_DEPARTMENT_OTHER): Payer: Self-pay

## 2024-01-03 ENCOUNTER — Ambulatory Visit (HOSPITAL_BASED_OUTPATIENT_CLINIC_OR_DEPARTMENT_OTHER)
Admission: RE | Admit: 2024-01-03 | Discharge: 2024-01-03 | Disposition: A | Discharge status: Home / Self Care | Payer: Medicare Other | Source: Ambulatory Visit | Attending: Family Medicine | Admitting: Family Medicine

## 2024-01-03 ENCOUNTER — Other Ambulatory Visit (HOSPITAL_BASED_OUTPATIENT_CLINIC_OR_DEPARTMENT_OTHER): Payer: Self-pay

## 2024-01-03 DIAGNOSIS — Z1231 Encounter for screening mammogram for malignant neoplasm of breast: Secondary | ICD-10-CM | POA: Insufficient documentation

## 2024-01-03 DIAGNOSIS — Z139 Encounter for screening, unspecified: Secondary | ICD-10-CM

## 2024-01-03 DIAGNOSIS — R92323 Mammographic fibroglandular density, bilateral breasts: Secondary | ICD-10-CM | POA: Diagnosis not present

## 2024-01-04 ENCOUNTER — Telehealth: Payer: Self-pay | Admitting: Neurology

## 2024-01-04 NOTE — Telephone Encounter (Signed)
Copied from CRM [id number]. Topic: Clinical - Medication Question >> Jan 04, 2024  2:58 PM Florestine Avers wrote: Reason for CRM: Patients friend "Tonia Ghent" called in stating that she has some medication inquires about the patient. The patients friend states that the patient does not speak [place] and she is just basically translating for her. She can be reached at [phone].

## 2024-01-05 NOTE — Telephone Encounter (Signed)
Called spoke with Tonia Ghent pt friend who speak english and translate for the family Discuss labs and medication changes. Myriam Jacobson stated just wanted to make sure pt understand and lab appt is scheduled.

## 2024-01-12 ENCOUNTER — Ambulatory Visit (HOSPITAL_BASED_OUTPATIENT_CLINIC_OR_DEPARTMENT_OTHER)
Admission: RE | Admit: 2024-01-12 | Discharge: 2024-01-12 | Disposition: A | Discharge status: Home / Self Care | Payer: Medicare Other | Source: Ambulatory Visit | Attending: Family Medicine | Admitting: Family Medicine

## 2024-01-12 DIAGNOSIS — Z780 Asymptomatic menopausal state: Secondary | ICD-10-CM | POA: Insufficient documentation

## 2024-01-12 DIAGNOSIS — E559 Vitamin D deficiency, unspecified: Secondary | ICD-10-CM | POA: Diagnosis not present

## 2024-01-12 DIAGNOSIS — M8589 Other specified disorders of bone density and structure, multiple sites: Secondary | ICD-10-CM | POA: Diagnosis not present

## 2024-01-12 DIAGNOSIS — Z13820 Encounter for screening for osteoporosis: Secondary | ICD-10-CM | POA: Insufficient documentation

## 2024-01-12 DIAGNOSIS — M8580 Other specified disorders of bone density and structure, unspecified site: Secondary | ICD-10-CM

## 2024-02-09 ENCOUNTER — Other Ambulatory Visit: Payer: Medicare Other

## 2024-02-15 ENCOUNTER — Telehealth: Payer: Self-pay | Admitting: *Deleted

## 2024-02-15 DIAGNOSIS — I700 Atherosclerosis of aorta: Secondary | ICD-10-CM

## 2024-02-15 NOTE — Telephone Encounter (Signed)
 Pt has lab appointment tomorrow morning and no future orders placed in EPIC.  Please place future orders or call pt to cancel appt if labs not needed at this time.

## 2024-02-16 ENCOUNTER — Other Ambulatory Visit (INDEPENDENT_AMBULATORY_CARE_PROVIDER_SITE_OTHER): Payer: Medicare Other

## 2024-02-16 DIAGNOSIS — I700 Atherosclerosis of aorta: Secondary | ICD-10-CM

## 2024-02-16 LAB — LIPID PANEL
Cholesterol: 162 mg/dL (ref 0–200)
HDL: 60.3 mg/dL (ref >39.00)
LDL Cholesterol: 82 mg/dL (ref 0–99)
NonHDL: 101.32
Total CHOL/HDL Ratio: 3
Triglycerides: 98 mg/dL (ref 0.0–149.0)
VLDL: 19.6 mg/dL (ref 0.0–40.0)

## 2024-02-18 ENCOUNTER — Ambulatory Visit

## 2024-02-18 ENCOUNTER — Other Ambulatory Visit: Payer: Self-pay

## 2024-02-18 DIAGNOSIS — E785 Hyperlipidemia, unspecified: Secondary | ICD-10-CM | POA: Diagnosis not present

## 2024-02-18 LAB — HEPATIC FUNCTION PANEL
ALT: 26 U/L (ref 0–35)
AST: 29 U/L (ref 0–37)
Albumin: 4.4 g/dL (ref 3.5–5.2)
Alkaline Phosphatase: 40 U/L (ref 39–117)
Bilirubin, Direct: 0.1 mg/dL (ref 0.0–0.3)
Total Bilirubin: 0.5 mg/dL (ref 0.2–1.2)
Total Protein: 6.4 g/dL (ref 6.0–8.3)

## 2024-02-18 NOTE — Telephone Encounter (Signed)
 Add on request faxed

## 2024-02-18 NOTE — Telephone Encounter (Signed)
 Pt came in on 02/16/24 for labs- last note said pt needed lipids and hep function panel. Lipids were only collected since labs werent in. Would you like me to add the hepatic function lab?

## 2024-03-08 ENCOUNTER — Other Ambulatory Visit: Payer: Self-pay | Admitting: Family Medicine

## 2024-03-08 ENCOUNTER — Other Ambulatory Visit (HOSPITAL_BASED_OUTPATIENT_CLINIC_OR_DEPARTMENT_OTHER): Payer: Self-pay

## 2024-03-08 DIAGNOSIS — S99911A Unspecified injury of right ankle, initial encounter: Secondary | ICD-10-CM

## 2024-03-08 MED ORDER — DICLOFENAC SODIUM 1 % EX GEL
1.0000 | Freq: Four times a day (QID) | CUTANEOUS | 0 refills | Status: AC | PRN
  Filled 2024-03-08: qty 100, 30d supply, fill #0

## 2024-03-17 ENCOUNTER — Other Ambulatory Visit (HOSPITAL_BASED_OUTPATIENT_CLINIC_OR_DEPARTMENT_OTHER): Payer: Self-pay

## 2024-03-17 MED ORDER — ZOSTER VAC RECOMB ADJUVANTED 50 MCG/0.5ML IM SUSR
0.5000 mL | Freq: Once | INTRAMUSCULAR | 0 refills | Status: AC
Start: 2024-03-17 — End: 2024-03-18
  Filled 2024-03-17: qty 0.5, 1d supply, fill #0

## 2024-06-12 ENCOUNTER — Other Ambulatory Visit (HOSPITAL_BASED_OUTPATIENT_CLINIC_OR_DEPARTMENT_OTHER): Payer: Self-pay

## 2024-06-12 MED ORDER — ZOSTER VAC RECOMB ADJUVANTED 50 MCG/0.5ML IM SUSR
0.5000 mL | Freq: Once | INTRAMUSCULAR | 0 refills | Status: AC
  Filled 2024-06-12: qty 0.5, 1d supply, fill #0

## 2024-06-27 ENCOUNTER — Other Ambulatory Visit (HOSPITAL_BASED_OUTPATIENT_CLINIC_OR_DEPARTMENT_OTHER): Payer: Self-pay

## 2024-06-27 ENCOUNTER — Telehealth: Payer: Self-pay | Admitting: Family Medicine

## 2024-06-27 ENCOUNTER — Other Ambulatory Visit: Payer: Self-pay | Admitting: Family Medicine

## 2024-06-27 DIAGNOSIS — I700 Atherosclerosis of aorta: Secondary | ICD-10-CM

## 2024-06-27 MED ORDER — SIMVASTATIN 40 MG PO TABS
40.0000 mg | ORAL_TABLET | Freq: Every day | ORAL | 0 refills | Status: DC
  Filled 2024-06-27: qty 90, 90d supply, fill #0

## 2024-06-27 NOTE — Telephone Encounter (Signed)
 LVM on pt's phone that we can schedule her and her husband back to back with Dr.Wendlng on 7/29 at 9:45 and 10. Asked that pt call us  back so we can get the appt scheduled. Please transfer call to office if there is any issues.

## 2024-07-03 ENCOUNTER — Ambulatory Visit: Admitting: Family Medicine

## 2024-07-04 ENCOUNTER — Ambulatory Visit: Payer: Self-pay | Admitting: Family Medicine

## 2024-07-04 ENCOUNTER — Other Ambulatory Visit (HOSPITAL_BASED_OUTPATIENT_CLINIC_OR_DEPARTMENT_OTHER): Payer: Self-pay

## 2024-07-04 ENCOUNTER — Encounter: Payer: Self-pay | Admitting: Family Medicine

## 2024-07-04 ENCOUNTER — Ambulatory Visit (INDEPENDENT_AMBULATORY_CARE_PROVIDER_SITE_OTHER): Admitting: Family Medicine

## 2024-07-04 VITALS — BP 130/74 | HR 53 | Temp 97.9°F | Resp 16 | Ht 59.0 in | Wt 131.0 lb

## 2024-07-04 DIAGNOSIS — I700 Atherosclerosis of aorta: Secondary | ICD-10-CM

## 2024-07-04 DIAGNOSIS — M8580 Other specified disorders of bone density and structure, unspecified site: Secondary | ICD-10-CM

## 2024-07-04 DIAGNOSIS — E782 Mixed hyperlipidemia: Secondary | ICD-10-CM | POA: Diagnosis not present

## 2024-07-04 DIAGNOSIS — K219 Gastro-esophageal reflux disease without esophagitis: Secondary | ICD-10-CM

## 2024-07-04 LAB — COMPREHENSIVE METABOLIC PANEL WITH GFR
ALT: 32 U/L (ref 0–35)
AST: 29 U/L (ref 0–37)
Albumin: 4.2 g/dL (ref 3.5–5.2)
Alkaline Phosphatase: 42 U/L (ref 39–117)
BUN: 17 mg/dL (ref 6–23)
CO2: 27 meq/L (ref 19–32)
Calcium: 9.5 mg/dL (ref 8.4–10.5)
Chloride: 104 meq/L (ref 96–112)
Creatinine, Ser: 0.78 mg/dL (ref 0.40–1.20)
GFR: 76.76 mL/min (ref >60.00)
Glucose, Bld: 102 mg/dL — ABNORMAL HIGH (ref 70–99)
Potassium: 3.7 meq/L (ref 3.5–5.1)
Sodium: 142 meq/L (ref 135–145)
Total Bilirubin: 0.4 mg/dL (ref 0.2–1.2)
Total Protein: 6.2 g/dL (ref 6.0–8.3)

## 2024-07-04 LAB — LIPID PANEL
Cholesterol: 157 mg/dL (ref 0–200)
HDL: 58 mg/dL (ref >39.00)
LDL Cholesterol: 61 mg/dL (ref 0–99)
NonHDL: 99.09
Total CHOL/HDL Ratio: 3
Triglycerides: 190 mg/dL — ABNORMAL HIGH (ref 0.0–149.0)
VLDL: 38 mg/dL (ref 0.0–40.0)

## 2024-07-04 LAB — VITAMIN D 25 HYDROXY (VIT D DEFICIENCY, FRACTURES): VITD: 21.5 ng/mL — ABNORMAL LOW (ref 30.00–100.00)

## 2024-07-04 MED ORDER — PANTOPRAZOLE SODIUM 40 MG PO TBEC
40.0000 mg | DELAYED_RELEASE_TABLET | Freq: Two times a day (BID) | ORAL | 3 refills | Status: DC
  Filled 2024-07-04: qty 180, 90d supply, fill #0

## 2024-07-04 MED ORDER — SIMVASTATIN 40 MG PO TABS
40.0000 mg | ORAL_TABLET | Freq: Every day | ORAL | 3 refills | Status: DC
  Filled 2024-07-04 – 2024-09-27 (×2): qty 90, 90d supply, fill #0

## 2024-07-04 MED ORDER — FENOFIBRATE 48 MG PO TABS
48.0000 mg | ORAL_TABLET | Freq: Every day | ORAL | 3 refills | Status: AC
Start: 2024-07-04 — End: ?
  Filled 2024-07-04: qty 90, 90d supply, fill #0

## 2024-07-04 MED ORDER — AZITHROMYCIN 500 MG PO TABS
500.0000 mg | ORAL_TABLET | Freq: Every day | ORAL | 0 refills | Status: AC
  Filled 2024-07-04: qty 3, 3d supply, fill #0

## 2024-07-04 NOTE — Progress Notes (Signed)
 Chief Complaint  Patient presents with   Follow-up    Follow Up    Subjective: Hyperlipidemia Patient presents for mixed hyperlipidemia follow up. Currently taking Zocor  40 mg daily, Tricor  48 mg daily and compliance with treatment thus far has been good. She denies myalgias. She is usually adhering to a healthy diet. Exercise: Walking No chest pain or shortness of breath The patient is not known to have coexisting coronary artery disease.  GERD Taking Protonix  40 mg twice daily.  Reports compliance and no adverse effects.  Symptoms are well-controlled on this regimen.  No trouble swallowing, pain, bleeding, or unintentional weight loss.  Past Medical History:  Diagnosis Date   Acid reflux    Aortic atherosclerosis (HCC)     Objective: There were no vitals taken for this visit. General: Awake, appears stated age Heart: RRR, no LE edema, no bruits Lungs: CTAB, no rales, wheezes or rhonchi. No accessory muscle use Psych: Age appropriate judgment and insight, normal affect and mood  Assessment and Plan: Mixed hyperlipidemia - Plan: Lipid panel, Comprehensive metabolic panel with GFR  Gastroesophageal reflux disease, unspecified whether esophagitis present  Atherosclerosis of aorta (HCC)  Osteopenia, unspecified location - Plan: VITAMIN D  25 Hydroxy (Vit-D Deficiency, Fractures)  Chronic, stable.  Continue Zocor  40 mg daily, Tricor  48 mg daily.  Counseled on diet and exercise. Chronic, stable.  Continue Protonix  40 mg twice daily.  Discussed reflux precautions.  No red flag signs/symptoms. F/u 6 months. The patient voiced understanding and agreement to the plan.  [name], DO 07/04/24  12:53 PM

## 2024-07-04 NOTE — Patient Instructions (Signed)
Give us 2-3 business days to get the results of your labs back.   Keep the diet clean and stay active.  Take 1200 mg of calcium daily and at least 1000 units of vitamin D3 daily.   Let us know if you need anything. 

## 2024-07-06 ENCOUNTER — Other Ambulatory Visit (HOSPITAL_BASED_OUTPATIENT_CLINIC_OR_DEPARTMENT_OTHER): Payer: Self-pay

## 2024-07-07 ENCOUNTER — Other Ambulatory Visit: Payer: Self-pay

## 2024-07-07 ENCOUNTER — Other Ambulatory Visit (HOSPITAL_BASED_OUTPATIENT_CLINIC_OR_DEPARTMENT_OTHER): Payer: Self-pay

## 2024-07-07 MED ORDER — METHYLPREDNISOLONE 4 MG PO TBPK
ORAL_TABLET | ORAL | 0 refills | Status: AC
  Filled 2024-07-07: qty 21, 6d supply, fill #0

## 2024-07-17 ENCOUNTER — Other Ambulatory Visit (HOSPITAL_BASED_OUTPATIENT_CLINIC_OR_DEPARTMENT_OTHER): Payer: Self-pay

## 2024-09-27 ENCOUNTER — Telehealth: Payer: Self-pay

## 2024-09-27 ENCOUNTER — Other Ambulatory Visit (HOSPITAL_BASED_OUTPATIENT_CLINIC_OR_DEPARTMENT_OTHER): Payer: Self-pay

## 2024-09-27 NOTE — Progress Notes (Signed)
 Pharmacy Quality Measure Review  This patient is appearing on a report for being at risk of failing the adherence measure for cholesterol (statin) medications this calendar year.   Medication: simvastatin  40 mg  Last fill date: 06/27/24 for 90 day supply  Per chart review and refill records, the patient is due for a refill of simvastatin . Attempted to contact the patient to ensure proper administration and to identify any missed doses. Unfortunately, I was unable to contact the patient but left a voicemail with a direct call-back number. Refilled the patient's prescription for simvastatin ; it should be ready to pick up by the end of today.  Woodie Jock, PharmD PGY1 Pharmacy Resident  09/27/2024

## 2024-10-07 ENCOUNTER — Other Ambulatory Visit (HOSPITAL_BASED_OUTPATIENT_CLINIC_OR_DEPARTMENT_OTHER): Payer: Self-pay

## 2024-10-18 ENCOUNTER — Other Ambulatory Visit (HOSPITAL_BASED_OUTPATIENT_CLINIC_OR_DEPARTMENT_OTHER): Payer: Self-pay

## 2024-10-18 MED ORDER — FLUZONE HIGH-DOSE 0.5 ML IM SUSY
0.5000 mL | PREFILLED_SYRINGE | Freq: Once | INTRAMUSCULAR | 0 refills | Status: AC
  Filled 2024-10-18: qty 0.5, 1d supply, fill #0

## 2024-10-28 ENCOUNTER — Encounter (HOSPITAL_BASED_OUTPATIENT_CLINIC_OR_DEPARTMENT_OTHER): Payer: Self-pay | Admitting: Pharmacist

## 2024-10-28 NOTE — Progress Notes (Signed)
 Pharmacy Quality Measure Review  This patient is appearing on a report for being at risk of failing the adherence measure for cholesterol (statin) medications this calendar year.   Medication: simvastatin   Last fill date: 06/27/2024 for 90 day supply  There was an unsuccessful outreach to patient on 09/27/2024 reminding her to refill simvastatin . [facility] pharmacy did fill simvastatin  10/22 but Rx was never picked up.   Tried again to reach patient to discuss adherence with simvastatin  and other medications. Unable to reach patient. LM on VM with CB# [phone] Also left message on VM of patient's daughter with CB# [phone]   Madelin Ray, PharmD Clinical Pharmacist [facility]  Population Health [phone]

## 2024-11-27 ENCOUNTER — Other Ambulatory Visit (HOSPITAL_BASED_OUTPATIENT_CLINIC_OR_DEPARTMENT_OTHER): Payer: Self-pay

## 2024-11-27 MED ORDER — CHLORHEXIDINE GLUCONATE 0.12 % MT SOLN
15.0000 mL | OROMUCOSAL | 3 refills | Status: AC
  Filled 2024-11-27: qty 473, 11d supply, fill #0

## 2024-11-27 MED ORDER — AMOXICILLIN 500 MG PO CAPS
ORAL_CAPSULE | ORAL | 0 refills | Status: AC
  Filled 2024-11-27: qty 40, 10d supply, fill #0

## 2024-12-15 ENCOUNTER — Other Ambulatory Visit: Payer: Self-pay

## 2024-12-15 DIAGNOSIS — E782 Mixed hyperlipidemia: Secondary | ICD-10-CM

## 2024-12-15 DIAGNOSIS — I700 Atherosclerosis of aorta: Secondary | ICD-10-CM

## 2024-12-15 DIAGNOSIS — K219 Gastro-esophageal reflux disease without esophagitis: Secondary | ICD-10-CM

## 2024-12-15 MED ORDER — SIMVASTATIN 40 MG PO TABS: 40.0000 mg | ORAL_TABLET | Freq: Every day | ORAL | 1 refills | Status: AC

## 2024-12-15 MED ORDER — PANTOPRAZOLE SODIUM 40 MG PO TBEC: 40.0000 mg | DELAYED_RELEASE_TABLET | Freq: Two times a day (BID) | ORAL | 1 refills | Status: AC

## 2024-12-18 ENCOUNTER — Telehealth: Payer: Self-pay | Admitting: Family Medicine

## 2024-12-18 NOTE — Telephone Encounter (Signed)
 Copied from CRM [id number]. Topic: Medicare AWV >> Dec 18, 2024  9:12 AM Nathanel DEL wrote: Called LVM 12/18/2024 to sched AWVI. Please schedule AWVI in office.   Nathanel Paschal; Care Guide Ambulatory Clinical Support [place] [facility] Direct Dial: [phone]

## 2024-12-27 ENCOUNTER — Ambulatory Visit

## 2024-12-27 VITALS — BP 128/76 | HR 60 | Ht 59.0 in | Wt 133.6 lb

## 2024-12-27 DIAGNOSIS — Z0000 Encounter for general adult medical examination without abnormal findings: Secondary | ICD-10-CM | POA: Diagnosis not present

## 2024-12-27 NOTE — Patient Instructions (Signed)
 Ms. Neace,  Thank you for taking the time for your Medicare Wellness Visit. I appreciate your continued commitment to your health goals. Please review the care plan we discussed, and feel free to reach out if I can assist you further.  Please note that Annual Wellness Visits do not include a physical exam. Some assessments may be limited, especially if the visit was conducted virtually. If needed, we may recommend an in-person follow-up with your provider.  Ongoing Care Seeing your primary care provider every 3 to 6 months helps us  monitor your health and provide consistent, personalized care.   Referrals If a referral was made during today's visit and you haven't received any updates within two weeks, please contact the referred provider directly to check on the status.  Recommended Screenings:  Health Maintenance  Topic Date Due   Medicare Annual Wellness Visit  03/07/2023   COVID-19 Vaccine (5 - 2025-26 season) 08/07/2024   DTaP/Tdap/Td vaccine (2 - Td or Tdap) 10/03/2025   Breast Cancer Screening  01/02/2026   Colon Cancer Screening  06/18/2027   Pneumococcal Vaccine for age over 101  Completed   Flu Shot  Completed   Osteoporosis screening with Bone Density Scan  Completed   Hepatitis C Screening  Completed   Zoster (Shingles) Vaccine  Completed   Meningitis B Vaccine  Aged Out       03/06/2022    9:16 AM  Advanced Directives  Does Patient Have a Medical Advance Directive? No    Vision: Annual vision screenings are recommended for early detection of glaucoma, cataracts, and diabetic retinopathy. These exams can also reveal signs of chronic conditions such as diabetes and high blood pressure.  Dental: Annual dental screenings help detect early signs of oral cancer, gum disease, and other conditions linked to overall health, including heart disease and diabetes.  Please see the attached documents for additional preventive care recommendations.

## 2024-12-27 NOTE — Progress Notes (Signed)
 " I connected with  Briah Grammatico on 12/27/24 by a in office visit   Patient Location: Other:  in office  Provider Location: Office/Clinic  Persons Participating in Visit: Patient.  Chief Complaint  Patient presents with   Medicare Wellness     Subjective:   Ori Alcivar is a 74 y.o. female who presents for a Medicare Annual Wellness Visit.  Visit info / Clinical Intake: Medicare Wellness Visit Type:: Subsequent Annual Wellness Visit Persons participating in visit and providing information:: patient Medicare Wellness Visit Mode:: In-person (required for WTM) Interpreter Needed?: No Pre-visit prep was completed: yes AWV questionnaire completed by patient prior to visit?: no Living arrangements:: lives with spouse/significant other Patient's Overall Health Status Rating: very good Typical amount of pain: some Does pain affect daily life?: no Are you currently prescribed opioids?: no  Dietary Habits and Nutritional Risks How many meals a day?: 3 Eats fruit and vegetables daily?: yes Most meals are obtained by: preparing own meals In the last 2 weeks, have you had any of the following?: none Diabetic:: no  Functional Status Activities of Daily Living (to include ambulation/medication): Independent Ambulation: Independent Medication Administration: Independent Home Management (perform basic housework or laundry): Independent Manage your own finances?: yes Primary transportation is: driving Concerns about vision?: no *vision screening is required for WTM* Concerns about hearing?: no  Fall Screening Falls in the past year?: 0 Number of falls in past year: 0 Was there an injury with Fall?: 0 Fall Risk Category Calculator: 0 Patient Fall Risk Level: Low Fall Risk  Fall Risk Patient at Risk for Falls Due to: No Fall Risks Fall risk Follow up: Falls evaluation completed  Home and Transportation Safety: All rugs have non-skid backing?: N/A, no rugs All stairs or steps have  railings?: N/A, no stairs Grab bars in the bathtub or shower?: yes Have non-skid surface in bathtub or shower?: yes Good home lighting?: yes Regular seat belt use?: yes  Cognitive Assessment Difficulty concentrating, remembering, or making decisions? : no Will 6CIT or Mini Cog be Completed: yes What year is it?: 0 points What month is it?: 0 points Give patient an address phrase to remember (5 components): apple , table, penny About what time is it?: 0 points Count backwards from 20 to 1: 0 points Say the months of the year in reverse: 0 points Repeat the address phrase from earlier: 0 points 6 CIT Score: 0 points  Advance Directives (For Healthcare) Does Patient Have a Medical Advance Directive?: No Would patient like information on creating a medical advance directive?: No - Patient declined  Reviewed/Updated  Reviewed/Updated: Reviewed All (Medical, Surgical, Family, Medications, Allergies, Care Teams, Patient Goals)    Allergies (verified) Patient has no known allergies.   Current Medications (verified) Outpatient Encounter Medications as of 12/27/2024  Medication Sig   cetirizine  (ZYRTEC ) 10 MG tablet Take 1 tablet (10 mg total) by mouth daily.   chlorhexidine  (PERIDEX ) 0.12 % solution Rinse with 1/2 ounce ( ) for 30 seconds then spit out 3 times daily for 2 weeks.   diclofenac  Sodium (VOLTAREN  ARTHRITIS PAIN) 1 % GEL Apply a thin layer topically 4 times daily as needed.   Glucosamine-Chondroitin (GLUCOSAMINE CHONDR COMPLEX PO) Take by mouth.   methylPREDNISolone  (MEDROL  DOSEPAK) 4 MG TBPK tablet Follow instructions on package; Use as directed.   Multiple Vitamin (MULTIVITAMIN ADULT PO) Take by mouth.   pantoprazole  (PROTONIX ) 40 MG tablet Take 1 tablet (40 mg total) by mouth 2 (two) times daily before a meal.  simvastatin  (ZOCOR ) 40 MG tablet Take 1 tablet (40 mg total) by mouth daily at 6 PM.   azithromycin  (ZITHROMAX ) 500 MG tablet Take 1 tablet (500 mg total) by  mouth daily in event of traveler's diarrhea or pneumonia.. (Patient not taking: Reported on 12/27/2024)   fenofibrate  (TRICOR ) 48 MG tablet Take 1 tablet (48 mg total) by mouth daily. (Patient not taking: Reported on 12/27/2024)   No facility-administered encounter medications on file as of 12/27/2024.    History: Past Medical History:  Diagnosis Date   Acid reflux    Aortic atherosclerosis    Past Surgical History:  Procedure Laterality Date   CESAREAN SECTION     3 times   Family History  Problem Relation Age of Onset   Alzheimer's disease Mother    Alzheimer's disease Father    Social History   Occupational History   Not on file  Tobacco Use   Smoking status: Never   Smokeless tobacco: Never  Vaping Use   Vaping status: Never Used  Substance and Sexual Activity   Alcohol use: Never    Alcohol/week: 0.0 standard drinks of alcohol   Drug use: Never   Sexual activity: Not on file   Tobacco Counseling Counseling given: Not Answered  SDOH Screenings   Food Insecurity: No Food Insecurity (12/27/2024)  Housing: Low Risk (12/27/2024)  Transportation Needs: No Transportation Needs (12/27/2024)  Utilities: Not At Risk (12/27/2024)  Depression (PHQ2-9): Low Risk (12/27/2024)  Financial Resource Strain: Low Risk (03/06/2022)  Physical Activity: Sufficiently Active (12/27/2024)  Social Connections: Socially Integrated (12/27/2024)  Stress: No Stress Concern Present (12/27/2024)  Tobacco Use: Low Risk (12/27/2024)  Health Literacy: Adequate Health Literacy (12/27/2024)   See flowsheets for full screening details  Depression Screen PHQ 2 & 9 Depression Scale- Over the past 2 weeks, how often have you been bothered by any of the following problems? Little interest or pleasure in doing things: 0 Feeling down, depressed, or hopeless (PHQ Adolescent also includes...irritable): 0 PHQ-2 Total Score: 0 Trouble falling or staying asleep, or sleeping too much: 1 Feeling tired or having  little energy: 0 Poor appetite or overeating (PHQ Adolescent also includes...weight loss): 0 Feeling bad about yourself - or that you are a failure or have let yourself or your family down: 0 Trouble concentrating on things, such as reading the newspaper or watching television (PHQ Adolescent also includes...like school work): 0 Moving or speaking so slowly that other people could have noticed. Or the opposite - being so fidgety or restless that you have been moving around a lot more than usual: 0 Thoughts that you would be better off dead, or of hurting yourself in some way: 0 PHQ-9 Total Score: 1 If you checked off any problems, how difficult have these problems made it for you to do your work, take care of things at home, or get along with other people?: Not difficult at all  Depression Treatment Depression Interventions/Treatment : EYV7-0 Score <4 Follow-up Not Indicated     Goals Addressed             This Visit's Progress    Patient Stated       Patient would like to cruise              Objective:    Today's Vitals   12/27/24 0938  BP: 128/76  Pulse: 60  Weight: 133 lb 9.6 oz (60.6 kg)  Height: 4' 11 (1.499 m)   Body mass index is 26.98 kg/m.  Hearing/Vision screen Hearing Screening - Comments:: No difficulties hearing  Vision Screening - Comments:: Patient wears glasses  Immunizations and Health Maintenance Health Maintenance  Topic Date Due   COVID-19 Vaccine (5 - 2025-26 season) 08/07/2024   DTaP/Tdap/Td (2 - Td or Tdap) 10/03/2025   Medicare Annual Wellness (AWV)  12/27/2025   Mammogram  01/02/2026   Colonoscopy  06/18/2027   Pneumococcal Vaccine: 50+ Years  Completed   Influenza Vaccine  Completed   Bone Density Scan  Completed   Hepatitis C Screening  Completed   Zoster Vaccines- Shingrix   Completed   Meningococcal B Vaccine  Aged Out        Assessment/Plan:  This is a routine wellness examination for Renea.  Patient Care Team: Frann Mabel Mt, DO as PCP - General (Family Medicine)  I have personally reviewed and noted the following in the patients chart:   Medical and social history Use of alcohol, tobacco or illicit drugs  Current medications and supplements including opioid prescriptions. Functional ability and status Nutritional status Physical activity Advanced directives List of other physicians Hospitalizations, surgeries, and ER visits in previous 12 months Vitals Screenings to include cognitive, depression, and falls Referrals and appointments  No orders of the defined types were placed in this encounter.  In addition, I have reviewed and discussed with patient certain preventive protocols, quality metrics, and best practice recommendations. A written personalized care plan for preventive services as well as general preventive health recommendations were provided to patient.   Lyle MARLA Right, [place]   12/27/2024   No follow-ups on file.  After Visit Summary:patient is in office   Nurse Notes: No voiced  "

## 2025-01-15 ENCOUNTER — Encounter: Admitting: Family Medicine
